# Patient Record
Sex: Female | Born: 1940
Health system: Southern US, Community
[De-identification: ages and names within clinical notes are randomized; demographics above are authoritative.]

## PROBLEM LIST (undated history)

## (undated) DIAGNOSIS — E785 Hyperlipidemia, unspecified: Secondary | ICD-10-CM

## (undated) DIAGNOSIS — I10 Essential (primary) hypertension: Secondary | ICD-10-CM

## (undated) DIAGNOSIS — M8580 Other specified disorders of bone density and structure, unspecified site: Secondary | ICD-10-CM

## (undated) DIAGNOSIS — E669 Obesity, unspecified: Secondary | ICD-10-CM

## (undated) DIAGNOSIS — T7840XA Allergy, unspecified, initial encounter: Secondary | ICD-10-CM

## (undated) HISTORY — DX: Hyperlipidemia, unspecified: E78.5

## (undated) HISTORY — DX: Obesity, unspecified: E66.9

## (undated) HISTORY — PX: OTHER SURGICAL HISTORY: SHX169

## (undated) HISTORY — DX: Allergy, unspecified, initial encounter: T78.40XA

## (undated) HISTORY — DX: Essential (primary) hypertension: I10

## (undated) HISTORY — DX: Other specified disorders of bone density and structure, unspecified site: M85.80

## (undated) HISTORY — PX: ABDOMINAL HYSTERECTOMY: SHX81

---

## 2006-09-17 ENCOUNTER — Ambulatory Visit: Payer: Self-pay | Admitting: Family Medicine

## 2006-09-24 ENCOUNTER — Ambulatory Visit: Payer: Self-pay | Admitting: Family Medicine

## 2006-10-02 ENCOUNTER — Encounter: Admission: RE | Admit: 2006-10-02 | Discharge: 2006-10-02 | Payer: Self-pay | Admitting: Family Medicine

## 2006-10-12 ENCOUNTER — Ambulatory Visit: Payer: Self-pay | Admitting: Family Medicine

## 2007-03-03 ENCOUNTER — Emergency Department (HOSPITAL_COMMUNITY): Admission: EM | Admit: 2007-03-03 | Discharge: 2007-03-03 | Payer: Self-pay | Admitting: Emergency Medicine

## 2007-08-14 ENCOUNTER — Ambulatory Visit: Payer: Self-pay | Admitting: Family Medicine

## 2007-10-24 HISTORY — PX: COLONOSCOPY: SHX174

## 2008-01-13 ENCOUNTER — Ambulatory Visit: Payer: Self-pay | Admitting: Family Medicine

## 2008-03-27 ENCOUNTER — Ambulatory Visit: Payer: Self-pay | Admitting: Family Medicine

## 2008-04-08 ENCOUNTER — Ambulatory Visit: Payer: Self-pay | Admitting: Internal Medicine

## 2008-04-22 ENCOUNTER — Encounter: Payer: Self-pay | Admitting: Internal Medicine

## 2008-04-22 ENCOUNTER — Ambulatory Visit: Payer: Self-pay | Admitting: Internal Medicine

## 2008-04-23 ENCOUNTER — Encounter: Payer: Self-pay | Admitting: Internal Medicine

## 2008-04-23 LAB — HM COLONOSCOPY: HM Colonoscopy: NEGATIVE

## 2008-07-28 ENCOUNTER — Ambulatory Visit: Payer: Self-pay | Admitting: Family Medicine

## 2008-10-08 ENCOUNTER — Ambulatory Visit: Payer: Self-pay | Admitting: Family Medicine

## 2008-11-19 ENCOUNTER — Ambulatory Visit: Payer: Self-pay | Admitting: Family Medicine

## 2009-07-07 ENCOUNTER — Ambulatory Visit: Payer: Self-pay | Admitting: Family Medicine

## 2009-09-23 ENCOUNTER — Ambulatory Visit: Payer: Self-pay | Admitting: Family Medicine

## 2010-05-30 ENCOUNTER — Ambulatory Visit: Payer: Self-pay | Admitting: Family Medicine

## 2010-08-22 ENCOUNTER — Ambulatory Visit: Payer: Self-pay | Admitting: Family Medicine

## 2010-09-19 ENCOUNTER — Ambulatory Visit: Payer: Self-pay | Admitting: Family Medicine

## 2010-11-13 ENCOUNTER — Encounter: Payer: Self-pay | Admitting: Family Medicine

## 2011-03-23 ENCOUNTER — Telehealth: Payer: Self-pay | Admitting: Family Medicine

## 2011-03-23 MED ORDER — QUINAPRIL-HYDROCHLOROTHIAZIDE 10-12.5 MG PO TABS: 1.0000 | ORAL_TABLET | Freq: Every day | ORAL | Status: DC

## 2011-03-23 NOTE — Telephone Encounter (Signed)
Refilled pt med 

## 2011-04-05 ENCOUNTER — Encounter: Payer: Self-pay | Admitting: Family Medicine

## 2011-04-06 ENCOUNTER — Ambulatory Visit (INDEPENDENT_AMBULATORY_CARE_PROVIDER_SITE_OTHER): Payer: Medicare HMO | Admitting: Family Medicine

## 2011-04-06 ENCOUNTER — Other Ambulatory Visit: Payer: Self-pay | Admitting: Family Medicine

## 2011-04-06 ENCOUNTER — Encounter: Payer: Self-pay | Admitting: Family Medicine

## 2011-04-06 VITALS — BP 110/80 | HR 68 | Wt 187.0 lb

## 2011-04-06 DIAGNOSIS — M8580 Other specified disorders of bone density and structure, unspecified site: Secondary | ICD-10-CM

## 2011-04-06 DIAGNOSIS — Z79899 Other long term (current) drug therapy: Secondary | ICD-10-CM

## 2011-04-06 DIAGNOSIS — I10 Essential (primary) hypertension: Secondary | ICD-10-CM

## 2011-04-06 DIAGNOSIS — E785 Hyperlipidemia, unspecified: Secondary | ICD-10-CM

## 2011-04-06 DIAGNOSIS — M949 Disorder of cartilage, unspecified: Secondary | ICD-10-CM

## 2011-04-06 LAB — CBC WITH DIFFERENTIAL/PLATELET
Eosinophils Absolute: 0.1 10*3/uL (ref 0.0–0.7)
Eosinophils Relative: 3 % (ref 0–5)
Hemoglobin: 13.7 g/dL (ref 12.0–15.0)
Lymphs Abs: 2.1 10*3/uL (ref 0.7–4.0)
MCH: 31.4 pg (ref 26.0–34.0)
MCV: 94.5 fL (ref 78.0–100.0)
Neutrophils Relative %: 36 % — ABNORMAL LOW (ref 43–77)
Platelets: 207 10*3/uL (ref 150–400)
RBC: 4.36 MIL/uL (ref 3.87–5.11)
RDW: 13.2 % (ref 11.5–15.5)

## 2011-04-06 LAB — COMPREHENSIVE METABOLIC PANEL
ALT: <8 U/L (ref 0–35)
AST: 19 U/L (ref 0–37)
Albumin: 4.3 g/dL (ref 3.5–5.2)
Alkaline Phosphatase: 77 U/L (ref 39–117)
BUN: 15 mg/dL (ref 6–23)
Calcium: 9.9 mg/dL (ref 8.4–10.5)
Chloride: 107 mEq/L (ref 96–112)
Potassium: 3.8 mEq/L (ref 3.5–5.3)
Sodium: 143 mEq/L (ref 135–145)
Total Bilirubin: 0.6 mg/dL (ref 0.3–1.2)
Total Protein: 7.5 g/dL (ref 6.0–8.3)

## 2011-04-06 LAB — LIPID PANEL: LDL Cholesterol: 100 mg/dL — ABNORMAL HIGH (ref 0–99)

## 2011-04-06 NOTE — Progress Notes (Signed)
  Subjective:    Patient ID: Kristina Maldonado, female    DOB: 25-Apr-1941, 70 y.o.   MRN: [id number]  HPI She is here for medication check. She continues on medications listed in the chart and is having no difficulty with them. She does have a history of osteopenia. Her last DEXA scan was in 2007. She keeps herself quite active and has a trip planned in the near future. She has no particular concerns or complaints.   Review of Systems Review of systems is essentially negative    Objective:   Physical Exam Alert and in no distress. Blood pressure is recorded.       Assessment & Plan:  Osteopenia. Hypertension. Hyperlipidemia. I will set her up for a DEXA scan. Also check routine blood work including vitamin D. Encouraged her to continue remain physically active.

## 2011-04-06 NOTE — Patient Instructions (Signed)
Stay on all your medicines. I will call you with the blood work results

## 2011-04-07 ENCOUNTER — Telehealth: Payer: Self-pay

## 2011-04-07 LAB — VITAMIN D 25 HYDROXY (VIT D DEFICIENCY, FRACTURES): Vit D, 25-Hydroxy: 36 ng/mL (ref 30–89)

## 2011-04-07 NOTE — Progress Notes (Signed)
  Subjective:    Patient ID: Kristina Maldonado, female    DOB: 11-06-1940, 70 y.o.   MRN: [id number]  HPI She is here for a recheck. She does have complaints of itching sensation to the right upper chest and is concerned about shingles   Review of Systems     Objective:   Physical Exam Alert and in no distress. Exam of that area shows no lesions.       Assessment & Plan:  Normal exam.

## 2011-04-07 NOTE — Telephone Encounter (Signed)
Called pt about labs left message labs look good

## 2011-04-11 ENCOUNTER — Ambulatory Visit
Admission: RE | Admit: 2011-04-11 | Discharge: 2011-04-11 | Disposition: A | Discharge status: Home / Self Care | Payer: Medicare HMO | Source: Ambulatory Visit | Attending: Family Medicine | Admitting: Family Medicine

## 2011-04-11 DIAGNOSIS — M8580 Other specified disorders of bone density and structure, unspecified site: Secondary | ICD-10-CM

## 2011-04-11 LAB — HM DEXA SCAN

## 2011-04-13 ENCOUNTER — Telehealth: Payer: Self-pay

## 2011-04-13 ENCOUNTER — Other Ambulatory Visit: Payer: Self-pay

## 2011-04-13 MED ORDER — ALENDRONATE SODIUM 70 MG PO TABS: 70.0000 mg | ORAL_TABLET | ORAL | Status: DC

## 2011-04-13 NOTE — Telephone Encounter (Signed)
Called pt to let her know of her results and Iput an order for her meds

## 2011-04-17 ENCOUNTER — Telehealth: Payer: Self-pay | Admitting: *Deleted

## 2011-04-17 NOTE — Telephone Encounter (Signed)
Left message for patient informing her to call our office and schedule appointment with DrLalonde to discuss treatment options for osteoporosis as her DEXA scan showed her osteoporosis to be worse than in 2007.

## 2011-04-27 ENCOUNTER — Other Ambulatory Visit: Payer: Self-pay | Admitting: Family Medicine

## 2011-05-09 ENCOUNTER — Ambulatory Visit (INDEPENDENT_AMBULATORY_CARE_PROVIDER_SITE_OTHER): Payer: Medicare HMO | Admitting: Family Medicine

## 2011-05-09 ENCOUNTER — Encounter: Payer: Self-pay | Admitting: Family Medicine

## 2011-05-09 VITALS — BP 122/86 | HR 60 | Wt 188.0 lb

## 2011-05-09 DIAGNOSIS — M8580 Other specified disorders of bone density and structure, unspecified site: Secondary | ICD-10-CM

## 2011-05-09 DIAGNOSIS — M899 Disorder of bone, unspecified: Secondary | ICD-10-CM

## 2011-05-09 NOTE — Progress Notes (Signed)
  Subjective:    Patient ID: Kristina Maldonado, female    DOB: [birth year], 70 y.o.   MRN: [id number]  HPI She is here for consultation concerning recent DEXA scan which did show worsening of her osteopenia. She exercises regularly, uses calcium and vitamin D. She does not smoke. There is no family history of osteoporosis. She was started on Fosamax in mid June   Review of Systems     Objective:   Physical Exam Alert and in no distress otherwise not examined.       Assessment & Plan:  Worsening osteopenia She will continue on her Fosamax. We will recheck her DEXA scan in 2 years.

## 2011-05-09 NOTE — Patient Instructions (Signed)
Take the Fosamax weekly on an empty stomach and in the sitting position. We'll do another DEXA scan in 2 years

## 2011-05-29 ENCOUNTER — Other Ambulatory Visit: Payer: Self-pay | Admitting: Family Medicine

## 2011-07-04 ENCOUNTER — Other Ambulatory Visit: Payer: Self-pay | Admitting: Family Medicine

## 2011-07-04 ENCOUNTER — Telehealth: Payer: Self-pay | Admitting: Family Medicine

## 2011-07-04 MED ORDER — QUINAPRIL-HYDROCHLOROTHIAZIDE 10-12.5 MG PO TABS: 1.0000 | ORAL_TABLET | Freq: Every day | ORAL | Status: DC

## 2011-07-04 NOTE — Telephone Encounter (Signed)
Medication refilled

## 2011-09-08 ENCOUNTER — Encounter: Payer: Self-pay | Admitting: Medical

## 2011-09-08 ENCOUNTER — Ambulatory Visit (INDEPENDENT_AMBULATORY_CARE_PROVIDER_SITE_OTHER): Payer: Medicare HMO | Admitting: Medical

## 2011-09-08 VITALS — BP 130/80 | HR 68 | Temp 98.5°F | Resp 16 | Wt 189.0 lb

## 2011-09-08 DIAGNOSIS — J069 Acute upper respiratory infection, unspecified: Secondary | ICD-10-CM | POA: Insufficient documentation

## 2011-09-08 DIAGNOSIS — Z23 Encounter for immunization: Secondary | ICD-10-CM

## 2011-09-08 MED ORDER — AMOXICILLIN 875 MG PO TABS: 875.0000 mg | ORAL_TABLET | Freq: Two times a day (BID) | ORAL | Status: AC

## 2011-09-08 NOTE — Patient Instructions (Signed)
Rest, hydrate well with water, drink some orange juice for some extra vitamin C, and consider OTC Mucinex DM or Coricidin HBP for cough and congestion.    Consider nasal saline spray OTC such as Neti Pot.   If you are worse over the weekend with fever, thick green nasal discharge or thick productive sputum, then begin Amoxicillin.   Otherwise, call if not improving or worse by early next week.   Upper Respiratory Infection, Adult An upper respiratory infection (URI) is also known as the common cold. It is often caused by a type of germ (virus). Colds are easily spread (contagious). You can pass it to others by kissing, coughing, sneezing, or drinking out of the same glass. Usually, you get better in 1 or 2 weeks.  HOME CARE   Only take medicine as told by your doctor.   Use a warm mist humidifier or breathe in steam from a hot shower.   Drink enough water and fluids to keep your pee (urine) clear or pale yellow.   Get plenty of rest.   Return to work when your temperature is back to normal or as told by your doctor. You may use a face mask and wash your hands to stop your cold from spreading.  GET HELP RIGHT AWAY IF:   After the first few days, you feel you are getting worse.   You have questions about your medicine.   You have chills, shortness of breath, or brown or red spit (mucus).   You have yellow or brown snot (nasal discharge) or pain in the face, especially when you bend forward.   You have a fever, puffy (swollen) neck, pain when you swallow, or white spots in the back of your throat.   You have a bad headache, ear pain, sinus pain, or chest pain.   You have a high-pitched whistling sound when you breathe in and out (wheezing).   You have a lasting cough or cough up blood.   You have sore muscles or a stiff neck.  MAKE SURE YOU:   Understand these instructions.   Will watch your condition.   Will get help right away if you are not doing well or get worse.    Document Released: 03/27/2008 Document Revised: 06/21/2011 Document Reviewed: 02/13/2011 [facility] Patient Information 2012 [place], [place].

## 2011-09-08 NOTE — Progress Notes (Signed)
Subjective:   HPI  Kristina Maldonado is a 70 y.o. female who presents for 3 day hx/o cough, scratchy throat, sneezing, feels sick, subjective fever, runny nose, ears hurt, chest congestion, sinus pressure.   She notes that she is rarely sick.  Usually gets sick this time of year.  Denies nausea, vomiting, no sick contacts.  No other aggravating or relieving factors.    No other c/o.  The following portions of the patient's history were reviewed and updated as appropriate: allergies, current medications, past family history, past medical history, past social history, past surgical history and problem list.  Past Medical History  Diagnosis Date  . Hypertension   . Obesity   . Dyslipidemia   . Osteopenia   . Allergy    Review of Systems Constitutional: -fever, +chills, +sweats, -unexpected -weight change,-fatigue ENT: +runny nose, +ear pain, +sore throat Cardiology:  -chest pain, -palpitations, -edema Respiratory: -cough, -shortness of breath, -wheezing Gastroenterology: -abdominal pain, -nausea, -vomiting, -diarrhea, +constipation Hematology: -bleeding or bruising problems Musculoskeletal: -arthralgias, -myalgias, -joint swelling, -back pain Ophthalmology: -vision changes Urology: -dysuria, -difficulty urinating, -hematuria, -urinary frequency, -urgency Neurology: -headache, -weakness, -tingling, -numbness    Objective:   Physical Exam  Filed Vitals:   09/08/11 1358  BP: 130/80  Pulse: 68  Temp: 98.5 F (36.9 C)  Resp: 16    General appearance: alert, no distress, WD/WN Skin: warm HEENT: normocephalic, sclerae anicteric, TMs pearly, nares patent, clear discharge, no erythema, pharynx with post nasal drip Oral cavity: MMM, no lesions Neck: supple, no lymphadenopathy, no thyromegaly, no masses Heart: RRR, normal S1, S2, no murmurs Lungs: CTA bilaterally, no wheezes, rhonchi, or rales   Assessment and Plan :    Encounter Diagnoses  Name Primary?  . URI (upper  respiratory infection) Yes  . Need for prophylactic vaccination and inoculation against influenza    Etiology c/w viral URI.  Discussed supportive care, but advised if worse over next 3-4 days with fever, green/colored thick discharge or sputum, then begin antibiotic Amoxicillin.  She agrees to hold off on antibiotic for now as symptoms suggestive of viral URI.   Flu shot given today.  Follow-up prn.

## 2011-09-11 ENCOUNTER — Telehealth: Payer: Self-pay | Admitting: Family Medicine

## 2011-09-11 NOTE — Telephone Encounter (Signed)
I entered the flu shot. CLS

## 2011-09-11 NOTE — Telephone Encounter (Signed)
Message copied by Janeice Robinson on Mon Sep 11, 2011  8:07 AM ------      Message from: [place], [place] S      Created: Fri Sep 08, 2011  7:58 PM       pls log flu vaccine

## 2011-09-25 ENCOUNTER — Ambulatory Visit (INDEPENDENT_AMBULATORY_CARE_PROVIDER_SITE_OTHER): Payer: Medicare HMO | Admitting: Family Medicine

## 2011-09-25 ENCOUNTER — Encounter: Payer: Self-pay | Admitting: Family Medicine

## 2011-09-25 DIAGNOSIS — J301 Allergic rhinitis due to pollen: Secondary | ICD-10-CM

## 2011-09-25 DIAGNOSIS — I10 Essential (primary) hypertension: Secondary | ICD-10-CM

## 2011-09-25 DIAGNOSIS — E785 Hyperlipidemia, unspecified: Secondary | ICD-10-CM

## 2011-09-25 DIAGNOSIS — M949 Disorder of cartilage, unspecified: Secondary | ICD-10-CM

## 2011-09-25 DIAGNOSIS — M8580 Other specified disorders of bone density and structure, unspecified site: Secondary | ICD-10-CM

## 2011-09-25 NOTE — Patient Instructions (Signed)
Continue to take good care of yourself 

## 2011-09-25 NOTE — Progress Notes (Signed)
  Subjective:    Patient ID: Kristina Maldonado, female    DOB: Mar 15, 1941, 70 y.o.   MRN: [id number]  HPI She is here for a general checkup. She continues on her pressure medications and is having no difficulty with them. She continues on aspirin. She does have history of osteopenia and presently is on Fosamax. Her allergies are under good control. She does not smoke or drink. She does note abdominal bloating especially dairy products. She does keep herself busy with part-time work as well as with her church and her children.   Review of Systems Negative except as above    Objective:   Physical Exam BP 120/76  Pulse 65  Ht 5\' 5"  (1.651 m)  Wt 188 lb (85.276 kg)  BMI 31.28 kg/m2  General Appearance:    Alert, cooperative, no distress, appears stated age  Head:    Normocephalic, without obvious abnormality, atraumatic  Eyes:    PERRL, conjunctiva/corneas clear, EOM's intact, fundi    benign  Ears:    Normal TM's and external ear canals  Nose:   Nares normal, mucosa normal, no drainage or sinus   tenderness  Throat:   Lips, mucosa, and tongue normal; teeth and gums normal  Neck:   Supple, no lymphadenopathy;  thyroid:  no   enlargement/tenderness/nodules; no carotid   bruit or JVD  Back:    Spine nontender, no curvature, ROM normal, no CVA     tenderness  Lungs:     Clear to auscultation bilaterally without wheezes, rales or     ronchi; respirations unlabored  Chest Wall:    No tenderness or deformity   Heart:    Regular rate and rhythm, S1 and S2 normal, no murmur, rub   or gallop  Breast Exam:    Deferred to GYN  Abdomen:     Soft, non-tender, nondistended, normoactive bowel sounds,    no masses, no hepatosplenomegaly  Genitalia:    Deferred      Extremities:   No clubbing, cyanosis or edema  Pulses:   2+ and symmetric all extremities  Skin:   Skin color, texture, turgor normal, no rashes or lesions  Lymph nodes:   Cervical, supraclavicular, and axillary nodes normal  Neurologic:    CNII-XII intact, normal strength, sensation and gait; reflexes 2+ and symmetric throughout          Psych:   Normal mood, affect, hygiene and grooming.           Assessment & Plan:   1. Hyperlipidemia   2. Hypertension   3. Osteopenia   4. Allergic rhinitis due to pollen    5 Lactose intolerance continue on present medications. Continue to use Lactaid for the abdominal bloating.

## 2011-10-25 ENCOUNTER — Other Ambulatory Visit: Payer: Self-pay | Admitting: Family Medicine

## 2011-11-16 ENCOUNTER — Other Ambulatory Visit: Payer: Self-pay

## 2011-11-16 MED ORDER — AMLODIPINE BESYLATE 10 MG PO TABS: 10.0000 mg | ORAL_TABLET | Freq: Every day | ORAL | Status: DC

## 2011-11-16 MED ORDER — ALENDRONATE SODIUM 70 MG PO TABS: 70.0000 mg | ORAL_TABLET | ORAL | Status: DC

## 2011-11-16 MED ORDER — ATORVASTATIN CALCIUM 40 MG PO TABS: 40.0000 mg | ORAL_TABLET | Freq: Every day | ORAL | Status: DC

## 2012-01-26 ENCOUNTER — Other Ambulatory Visit: Payer: Self-pay | Admitting: Medical

## 2012-01-26 ENCOUNTER — Telehealth: Payer: Self-pay | Admitting: Family Medicine

## 2012-01-26 MED ORDER — QUINAPRIL-HYDROCHLOROTHIAZIDE 10-12.5 MG PO TABS: 1.0000 | ORAL_TABLET | Freq: Every day | ORAL | Status: DC

## 2012-01-26 NOTE — Telephone Encounter (Signed)
Advised pt of refill.

## 2012-01-26 NOTE — Telephone Encounter (Signed)
Quinipril/HCT sent to mail order

## 2012-01-26 NOTE — Telephone Encounter (Signed)
Her "fluid pill" Rx did not get sent to mail order co,    Needs sent   [facility]

## 2012-02-08 ENCOUNTER — Encounter: Payer: Self-pay | Admitting: Internal Medicine

## 2012-04-18 ENCOUNTER — Ambulatory Visit (INDEPENDENT_AMBULATORY_CARE_PROVIDER_SITE_OTHER): Payer: Medicare HMO | Admitting: Family Medicine

## 2012-04-18 ENCOUNTER — Encounter: Payer: Self-pay | Admitting: Family Medicine

## 2012-04-18 VITALS — BP 132/80 | HR 80 | Wt 192.0 lb

## 2012-04-18 DIAGNOSIS — R609 Edema, unspecified: Secondary | ICD-10-CM

## 2012-04-18 NOTE — Progress Notes (Signed)
  Subjective:    Patient ID: Kristina Maldonado, female    DOB: Jun 14, 1941, 71 y.o.   MRN: [id number]  HPI She has a one-week history of right foot and ankle swelling. No history of injury however she just went to [place] and did a lot more walking than normal.. No calf pain, shortness of breath or coughing.  Review of Systems     Objective:   Physical Exam Exam of the right lower extremity does show slightly more edema than left with slight pitting. Pulses are normal. Homans sign negative. No adenopathy noted especially in the inguinal area.       Assessment & Plan:  Peripheral edema. Explained that this was probably related to her walking but did tell her to call me if continued difficulty. Also recommend support stockings for this. Do not feel that diuretics would be appropriate.

## 2012-05-01 ENCOUNTER — Other Ambulatory Visit: Payer: Self-pay | Admitting: Medical

## 2012-05-01 NOTE — Telephone Encounter (Signed)
Patient needs to follow up appointment before the medication runs out.

## 2012-06-03 ENCOUNTER — Telehealth: Payer: Self-pay | Admitting: Internal Medicine

## 2012-06-03 NOTE — Telephone Encounter (Signed)
Pt wants to talk to you about this. Not sure if you can answer it or not

## 2012-06-03 NOTE — Telephone Encounter (Signed)
CALLED PT TO HAVE HER CALL INS TO SEE IF THEY WOULD COVER OR NOT

## 2012-08-19 ENCOUNTER — Other Ambulatory Visit: Payer: Self-pay | Admitting: Family Medicine

## 2012-09-18 ENCOUNTER — Encounter: Payer: Self-pay | Admitting: Internal Medicine

## 2012-10-07 ENCOUNTER — Encounter: Payer: Self-pay | Admitting: Family Medicine

## 2012-10-07 ENCOUNTER — Ambulatory Visit (INDEPENDENT_AMBULATORY_CARE_PROVIDER_SITE_OTHER): Payer: Medicare HMO | Admitting: Family Medicine

## 2012-10-07 VITALS — BP 130/90 | HR 45 | Ht 65.5 in | Wt 188.0 lb

## 2012-10-07 DIAGNOSIS — I10 Essential (primary) hypertension: Secondary | ICD-10-CM

## 2012-10-07 DIAGNOSIS — Z0000 Encounter for general adult medical examination without abnormal findings: Secondary | ICD-10-CM

## 2012-10-07 DIAGNOSIS — J301 Allergic rhinitis due to pollen: Secondary | ICD-10-CM

## 2012-10-07 DIAGNOSIS — Z79899 Other long term (current) drug therapy: Secondary | ICD-10-CM

## 2012-10-07 DIAGNOSIS — M810 Age-related osteoporosis without current pathological fracture: Secondary | ICD-10-CM

## 2012-10-07 DIAGNOSIS — E785 Hyperlipidemia, unspecified: Secondary | ICD-10-CM

## 2012-10-07 LAB — COMPREHENSIVE METABOLIC PANEL
ALT: 8 U/L (ref 0–35)
AST: 23 U/L (ref 0–37)
Alkaline Phosphatase: 57 U/L (ref 39–117)
CO2: 25 mEq/L (ref 19–32)
Chloride: 105 mEq/L (ref 96–112)
Glucose, Bld: 83 mg/dL (ref 70–99)
Total Bilirubin: 0.6 mg/dL (ref 0.3–1.2)
Total Protein: 7.1 g/dL (ref 6.0–8.3)

## 2012-10-07 LAB — CBC WITH DIFFERENTIAL/PLATELET
HCT: 41.9 % (ref 36.0–46.0)
Hemoglobin: 14.5 g/dL (ref 12.0–15.0)
Lymphocytes Relative: 42 % (ref 12–46)
Lymphs Abs: 1.9 10*3/uL (ref 0.7–4.0)
MCH: 32.4 pg (ref 26.0–34.0)
MCHC: 34.6 g/dL (ref 30.0–36.0)
Monocytes Relative: 6 % (ref 3–12)
Neutro Abs: 2.3 10*3/uL (ref 1.7–7.7)
Neutrophils Relative %: 50 % (ref 43–77)
Platelets: 220 10*3/uL (ref 150–400)
RBC: 4.48 MIL/uL (ref 3.87–5.11)

## 2012-10-07 LAB — POCT URINALYSIS DIPSTICK
Bilirubin, UA: NEGATIVE
Glucose, UA: NEGATIVE
Ketones, UA: NEGATIVE
Leukocytes, UA: NEGATIVE
Protein, UA: NEGATIVE
Spec Grav, UA: <=1.005

## 2012-10-07 LAB — LIPID PANEL
Cholesterol: 227 mg/dL — ABNORMAL HIGH (ref 0–200)
Total CHOL/HDL Ratio: 2.1 Ratio

## 2012-10-07 MED ORDER — ALENDRONATE SODIUM 70 MG PO TABS: 70.0000 mg | ORAL_TABLET | ORAL | Status: DC

## 2012-10-07 MED ORDER — AMLODIPINE BESYLATE 10 MG PO TABS: 10.0000 mg | ORAL_TABLET | Freq: Every day | ORAL | Status: DC

## 2012-10-07 MED ORDER — QUINAPRIL-HYDROCHLOROTHIAZIDE 10-12.5 MG PO TABS: 1.0000 | ORAL_TABLET | Freq: Every day | ORAL | Status: DC

## 2012-10-07 MED ORDER — ATORVASTATIN CALCIUM 40 MG PO TABS: 40.0000 mg | ORAL_TABLET | Freq: Every day | ORAL | Status: DC

## 2012-10-07 NOTE — Progress Notes (Signed)
  Subjective:    Patient ID: Kristina Maldonado, female    DOB: [birth year], 71 y.o.   MRN: [id number]  HPI She is here for a complete examination. She continues on her blood pressure medications and is having no difficulty with them. She also takes Lipitor. She has been on Fosamax for 2 years. Her allergies are under good control. Her immunizations are up to date. She had a colonoscopy in 2009. She's had a hysterectomy. She has no particular concerns or complaints.   Review of Systems  Constitutional: Negative.   HENT: Negative.   Eyes: Negative.   Respiratory: Negative.   Cardiovascular: Negative.   Gastrointestinal: Negative.   Genitourinary: Negative.   Musculoskeletal: Negative.   Neurological: Negative.   Hematological: Negative.   Psychiatric/Behavioral: Negative.        Objective:   Physical Exam BP 130/90  Pulse 45  Ht 5' 5.5" (1.664 m)  Wt 188 lb (85.276 kg)  BMI 30.81 kg/m2  SpO2 98%  General Appearance:    Alert, cooperative, no distress, appears stated age  Head:    Normocephalic, without obvious abnormality, atraumatic  Eyes:    PERRL, conjunctiva/corneas clear, EOM's intact, fundi    benign  Ears:    Normal TM's and external ear canals  Nose:   Nares normal, mucosa normal, no drainage or sinus   tenderness  Throat:   Lips, mucosa, and tongue normal; teeth and gums normal  Neck:   Supple, no lymphadenopathy;  thyroid:  no   enlargement/tenderness/nodules; no carotid   bruit or JVD  Back:    Spine nontender, no curvature, ROM normal, no CVA     tenderness  Lungs:     Clear to auscultation bilaterally without wheezes, rales or     ronchi; respirations unlabored  Chest Wall:    No tenderness or deformity   Heart:    Regular rate and rhythm, S1 and S2 normal, no murmur, rub   or gallop  Breast Exam:    Deferred to GYN  Abdomen:     Soft, non-tender, nondistended, normoactive bowel sounds,    no masses, no hepatosplenomegaly  Genitalia:    Deferred to GYN      Extremities:   No clubbing, cyanosis or edema  Pulses:   2+ and symmetric all extremities  Skin:   Skin color, texture, turgor normal, no rashes or lesions  Lymph nodes:   Cervical, supraclavicular, and axillary nodes normal  Neurologic:   CNII-XII intact, normal strength, sensation and gait; reflexes 2+ and symmetric throughout          Psych:   Normal mood, affect, hygiene and grooming.           Assessment & Plan:   1. Hypertension  quinapril-hydrochlorothiazide (ACCURETIC) 10-12.5 MG per tablet, amLODipine (NORVASC) 10 MG tablet, Lipid panel, CBC with Differential, Comprehensive metabolic panel, POCT Urinalysis Dipstick  2. Hyperlipidemia  atorvastatin (LIPITOR) 40 MG tablet, Lipid panel, CBC with Differential, Comprehensive metabolic panel  3. Allergic rhinitis due to pollen    4. Osteoporosis  alendronate (FOSAMAX) 70 MG tablet, Comprehensive metabolic panel  5. Routine general medical examination at a health care facility    6. Encounter for long-term (current) use of other medications  Lipid panel, CBC with Differential, Comprehensive metabolic panel   I encouraged her to continue to take good care of herself.

## 2012-10-08 ENCOUNTER — Other Ambulatory Visit: Payer: Self-pay | Admitting: Medical

## 2012-10-08 NOTE — Progress Notes (Signed)
Quick Note:  CALLED PT CELL # PT INFORMED OF LABS AND VERBALIZED UNDERSTANDING ______

## 2012-12-25 LAB — HM DIABETES EYE EXAM

## 2013-06-09 ENCOUNTER — Telehealth: Payer: Self-pay | Admitting: Family Medicine

## 2013-06-09 NOTE — Telephone Encounter (Signed)
Pt called states she got stung on Friday and there is a large red egg on the inside of her thigh.  There is no streaking, only itching and redness.  She will watch and treat over the counter for a couple of days and let us know if she needs to come in.

## 2013-06-18 ENCOUNTER — Encounter: Payer: Self-pay | Admitting: Medical

## 2013-06-18 ENCOUNTER — Ambulatory Visit (INDEPENDENT_AMBULATORY_CARE_PROVIDER_SITE_OTHER): Payer: Medicare HMO | Admitting: Medical

## 2013-06-18 VITALS — BP 132/82 | HR 68 | Temp 97.8°F | Resp 16 | Wt 191.0 lb

## 2013-06-18 DIAGNOSIS — R21 Rash and other nonspecific skin eruption: Secondary | ICD-10-CM

## 2013-06-18 MED ORDER — HYDROXYZINE HCL 25 MG PO TABS: ORAL_TABLET | ORAL | Status: DC

## 2013-06-18 MED ORDER — DOXYCYCLINE HYCLATE 100 MG PO TABS: 100.0000 mg | ORAL_TABLET | Freq: Two times a day (BID) | ORAL | Status: DC

## 2013-06-18 NOTE — Progress Notes (Signed)
Subjective: Here for rash.  Last week was out in the garden and since then started having a sting sensation on her upper left upper thigh.  Started noticing red rash that comes and goes.  Used cortisone cream topically. Started stinging again yesterday.  Denies specific bite or tick, but not sure what this is.    Objective: Gen: wd, wn, nad Skin: left upper lateral thigh with large patch of raised whealed rash, but faint coloration/skin color, only slightly pink coloration.  There is a more central 3cm slightly warm, raised whealed lesion suggestive of insect bite.  No obvious cellulitis.  Leg without edema, normal pulses and sensation  Assessment: Encounter Diagnosis  Name Primary?  . Rash and nonspecific skin eruption Yes   Plan: Etiology appears to be insect bite and hives.  There is some warmth.  Can c/t topical Cortaid OTC.  Patient Instructions  Your current rash of left hip looks like an insect bite and possible allergic response/hive.    Begin Hydroxyzine 25mg , 1/2- 1 tablet two to three times daily for itching and rash.  If you begin seeing worse redness, hot feeling, pain in the area, either let me know, recheck, or begin Doxycycline antibiotic.

## 2013-06-18 NOTE — Patient Instructions (Signed)
Your current rash of left hip looks like an insect bite and possible allergic response/hive.    Begin Hydroxyzine 25mg , 1/2- 1 tablet two to three times daily for itching and rash.  If you begin seeing worse redness, hot feeling, pain in the area, either let me know, recheck, or begin Doxycycline antibiotic.

## 2013-07-09 ENCOUNTER — Encounter: Payer: Self-pay | Admitting: Internal Medicine

## 2013-10-10 ENCOUNTER — Encounter: Payer: Self-pay | Admitting: Family Medicine

## 2013-10-10 ENCOUNTER — Ambulatory Visit (INDEPENDENT_AMBULATORY_CARE_PROVIDER_SITE_OTHER): Payer: Medicare HMO | Admitting: Family Medicine

## 2013-10-10 ENCOUNTER — Telehealth: Payer: Self-pay | Admitting: Family Medicine

## 2013-10-10 VITALS — BP 120/82 | HR 78 | Ht 66.0 in | Wt 191.0 lb

## 2013-10-10 DIAGNOSIS — M810 Age-related osteoporosis without current pathological fracture: Secondary | ICD-10-CM | POA: Diagnosis not present

## 2013-10-10 DIAGNOSIS — M1990 Unspecified osteoarthritis, unspecified site: Secondary | ICD-10-CM

## 2013-10-10 DIAGNOSIS — Z0000 Encounter for general adult medical examination without abnormal findings: Secondary | ICD-10-CM | POA: Diagnosis not present

## 2013-10-10 DIAGNOSIS — Z79899 Other long term (current) drug therapy: Secondary | ICD-10-CM

## 2013-10-10 DIAGNOSIS — J301 Allergic rhinitis due to pollen: Secondary | ICD-10-CM | POA: Diagnosis not present

## 2013-10-10 DIAGNOSIS — E785 Hyperlipidemia, unspecified: Secondary | ICD-10-CM | POA: Diagnosis not present

## 2013-10-10 DIAGNOSIS — M129 Arthropathy, unspecified: Secondary | ICD-10-CM

## 2013-10-10 DIAGNOSIS — I10 Essential (primary) hypertension: Secondary | ICD-10-CM | POA: Diagnosis not present

## 2013-10-10 LAB — CBC WITH DIFFERENTIAL/PLATELET
Basophils Absolute: 0 10*3/uL (ref 0.0–0.1)
Basophils Relative: 1 % (ref 0–1)
Eosinophils Absolute: 0.1 10*3/uL (ref 0.0–0.7)
MCH: 32.2 pg (ref 26.0–34.0)
MCHC: 34.6 g/dL (ref 30.0–36.0)
Monocytes Relative: 7 % (ref 3–12)
Neutrophils Relative %: 53 % (ref 43–77)
Platelets: 218 10*3/uL (ref 150–400)
RDW: 14.2 % (ref 11.5–15.5)

## 2013-10-10 LAB — LIPID PANEL
LDL Cholesterol: 97 mg/dL (ref 0–99)
Triglycerides: 47 mg/dL (ref <150)
VLDL: 9 mg/dL (ref 0–40)

## 2013-10-10 LAB — COMPREHENSIVE METABOLIC PANEL
AST: 17 U/L (ref 0–37)
Alkaline Phosphatase: 65 U/L (ref 39–117)
Glucose, Bld: 91 mg/dL (ref 70–99)
Sodium: 141 mEq/L (ref 135–145)
Total Bilirubin: 0.5 mg/dL (ref 0.3–1.2)
Total Protein: 6.9 g/dL (ref 6.0–8.3)

## 2013-10-10 LAB — POCT URINALYSIS DIPSTICK
Blood, UA: NEGATIVE
Leukocytes, UA: NEGATIVE
Nitrite, UA: NEGATIVE
Protein, UA: NEGATIVE
Urobilinogen, UA: NEGATIVE
pH, UA: 6

## 2013-10-10 MED ORDER — ATORVASTATIN CALCIUM 40 MG PO TABS: 40.0000 mg | ORAL_TABLET | Freq: Every day | ORAL | Status: DC

## 2013-10-10 MED ORDER — AMLODIPINE BESYLATE 10 MG PO TABS: 10.0000 mg | ORAL_TABLET | Freq: Every day | ORAL | Status: DC

## 2013-10-10 MED ORDER — ALENDRONATE SODIUM 70 MG PO TABS: 70.0000 mg | ORAL_TABLET | ORAL | Status: DC

## 2013-10-10 MED ORDER — QUINAPRIL-HYDROCHLOROTHIAZIDE 10-12.5 MG PO TABS: ORAL_TABLET | ORAL | Status: DC

## 2013-10-10 NOTE — Progress Notes (Signed)
   Subjective:    Patient ID: Kristina Maldonado, female    DOB: 02-10-1941, 72 y.o.   MRN: [id number]  HPI Is here for complete examination. She does have hypertension and continues on her medications. She also is taking Fosamax and does have a history of osteoporosis. She continues on Lipitor without difficulty. Her allergies are under good control. She has noted more aches and pains especially in her joints after she has been physically active. Her immunizations were reviewed. She has had a colonoscopy. She has had a recent DEXA scan. A full and family history. She is retired and enjoying her retirement. She does travel and visit her relatives.   Review of Systems Negative except as above    Objective:   Physical Exam BP 120/82  Pulse 78  Ht 5\' 6"  (1.676 m)  Wt 191 lb (86.637 kg)  BMI 30.84 kg/m2  SpO2 98%  General Appearance:    Alert, cooperative, no distress, appears stated age  Head:    Normocephalic, without obvious abnormality, atraumatic  Eyes:    PERRL, conjunctiva/corneas clear, EOM's intact, fundi    benign  Ears:    Normal TM's and external ear canals  Nose:   Nares normal, mucosa normal, no drainage or sinus   tenderness  Throat:   Lips, mucosa, and tongue normal; teeth and gums normal  Neck:   Supple, no lymphadenopathy;  thyroid:  no   enlargement/tenderness/nodules; no carotid   bruit or JVD  Back:    Spine nontender, no curvature, ROM normal, no CVA     tenderness  Lungs:     Clear to auscultation bilaterally without wheezes, rales or     ronchi; respirations unlabored  Chest Wall:    No tenderness or deformity   Heart:    Regular rate and rhythm, S1 and S2 normal, no murmur, rub   or gallop  Breast Exam:    Deferred to GYN  Abdomen:     Soft, non-tender, nondistended, normoactive bowel sounds,    no masses, no hepatosplenomegaly  Genitalia:    Deferred to GYN     Extremities:   No clubbing, cyanosis or edema  Pulses:   2+ and symmetric all extremities  Skin:    Skin color, texture, turgor normal, no rashes or lesions  Lymph nodes:   Cervical, supraclavicular, and axillary nodes normal  Neurologic:   CNII-XII intact, normal strength, sensation and gait; reflexes 2+ and symmetric throughout          Psych:   Normal mood, affect, hygiene and grooming.          Assessment & Plan:  Hypertension - Plan: POCT Urinalysis Dipstick, amLODipine (NORVASC) 10 MG tablet, quinapril-hydrochlorothiazide (ACCURETIC) 10-12.5 MG per tablet, CBC with Differential, Comprehensive metabolic panel  Osteoporosis - Plan: alendronate (FOSAMAX) 70 MG tablet, DG Bone Density  Hyperlipidemia - Plan: atorvastatin (LIPITOR) 40 MG tablet  Encounter for long-term (current) use of other medications - Plan: DG Bone Density, CBC with Differential, Comprehensive metabolic panel, Lipid panel  Allergic rhinitis due to pollen  Arthritis  Routine general medical examination at a health care facility - Plan: CBC with Differential, Comprehensive metabolic panel, Lipid panel  stool cards given. Continue on her other medications. Recommend Tylenol for her aches and pains. Encouraged her to continue with her active lifestyle.

## 2013-10-10 NOTE — Telephone Encounter (Signed)
All of her Rx's need to go to [facility] for 90 day supply not to [facility]

## 2013-10-10 NOTE — Patient Instructions (Signed)
Tylenol for aches and pains

## 2013-10-21 ENCOUNTER — Other Ambulatory Visit (INDEPENDENT_AMBULATORY_CARE_PROVIDER_SITE_OTHER): Payer: Medicare HMO

## 2013-10-21 DIAGNOSIS — Z1211 Encounter for screening for malignant neoplasm of colon: Secondary | ICD-10-CM

## 2013-10-21 LAB — HEMOCCULT GUIAC POC 1CARD (OFFICE): Card #2 Fecal Occult Blod, POC: NEGATIVE

## 2013-11-03 ENCOUNTER — Other Ambulatory Visit: Payer: Self-pay | Admitting: Family Medicine

## 2013-12-19 ENCOUNTER — Other Ambulatory Visit: Payer: Self-pay | Admitting: Family Medicine

## 2014-02-09 LAB — HM MAMMOGRAPHY: HM Mammogram: NEGATIVE

## 2014-02-17 ENCOUNTER — Encounter: Payer: Self-pay | Admitting: Family Medicine

## 2014-03-18 LAB — HM DIABETES EYE EXAM

## 2014-03-27 ENCOUNTER — Encounter: Payer: Self-pay | Admitting: Family Medicine

## 2014-04-02 ENCOUNTER — Encounter: Payer: Self-pay | Admitting: Internal Medicine

## 2014-05-29 ENCOUNTER — Ambulatory Visit (INDEPENDENT_AMBULATORY_CARE_PROVIDER_SITE_OTHER): Payer: Commercial Managed Care - HMO | Admitting: Family Medicine

## 2014-05-29 VITALS — BP 124/80 | HR 85 | Wt 196.0 lb

## 2014-05-29 DIAGNOSIS — R51 Headache: Secondary | ICD-10-CM

## 2014-05-29 DIAGNOSIS — M810 Age-related osteoporosis without current pathological fracture: Secondary | ICD-10-CM

## 2014-05-29 NOTE — Patient Instructions (Addendum)
Take 4 ibuprofen 3 times per day. If your headache gets worse give me a call

## 2014-05-29 NOTE — Progress Notes (Signed)
   Subjective:    Patient ID: Kristina Maldonado, female    DOB: 1941/08/05, 73 y.o.   MRN: [id number]  HPI She complains of a two-day history of left-sided headache. She has no previous history of difficulty with headache. She's had no nausea, vomiting, blurred or double vision, sneezing, itchy watery eyes. She does take it she feels as if she is leaning to the left.  Review of Systems     Objective:   Physical Exam alert and in no distress. Cerebellar testing normal. Normal finger to nose. EOMI. DTRs normal. Exam of the scalp shows no lesions. Tympanic membranes and canals are normal. Throat is clear. Tonsils are normal. Neck is supple without adenopathy or thyromegaly. Cardiac exam shows a regular sinus rhythm without murmurs or gallops. Lungs are clear to auscultation. Review of her record indicates need for repeat DEXA scan due to history of osteoporosis.      Assessment & Plan:  Headache(784.0)  Osteoporosis - Plan: DG Bone Density  will treat the headache conservatively with ibuprofen. Cautioned her to call me if the symptoms worsen. It is bothersome that she does not have a previous history of headaches. If she gets worse, further evaluation will be needed.

## 2014-06-05 LAB — HM DEXA SCAN

## 2014-06-18 ENCOUNTER — Encounter: Payer: Self-pay | Admitting: Internal Medicine

## 2014-06-18 ENCOUNTER — Encounter: Payer: Self-pay | Admitting: Family Medicine

## 2014-07-02 ENCOUNTER — Telehealth: Payer: Self-pay | Admitting: Family Medicine

## 2014-07-02 NOTE — Telephone Encounter (Signed)
She has low bone mass which says that her medication is working. Have her continue on that and we can recheck this DEXA scan in 2 years.

## 2014-07-02 NOTE — Telephone Encounter (Signed)
LEFT WORD FOR WORD MESSAGE ON MACHINE

## 2014-07-02 NOTE — Telephone Encounter (Signed)
Pt was in the office today so she wanted to know if Dr Susann Givens had rcvd her Bone Density report from 06/05/14, Advised her that it did come to our office. She then wanted to know if it meant that it was normal and she was to continue on her same meds since she had not heard anything different from Dr Susann Givens.

## 2014-07-15 ENCOUNTER — Encounter: Payer: Self-pay | Admitting: Internal Medicine

## 2014-10-21 ENCOUNTER — Ambulatory Visit (INDEPENDENT_AMBULATORY_CARE_PROVIDER_SITE_OTHER): Payer: Commercial Managed Care - HMO | Admitting: Family Medicine

## 2014-10-21 ENCOUNTER — Encounter: Payer: Self-pay | Admitting: Family Medicine

## 2014-10-21 VITALS — BP 120/80 | HR 78 | Ht 65.0 in | Wt 197.0 lb

## 2014-10-21 DIAGNOSIS — M810 Age-related osteoporosis without current pathological fracture: Secondary | ICD-10-CM

## 2014-10-21 DIAGNOSIS — E785 Hyperlipidemia, unspecified: Secondary | ICD-10-CM

## 2014-10-21 DIAGNOSIS — Z79899 Other long term (current) drug therapy: Secondary | ICD-10-CM

## 2014-10-21 DIAGNOSIS — Z0000 Encounter for general adult medical examination without abnormal findings: Secondary | ICD-10-CM

## 2014-10-21 DIAGNOSIS — I10 Essential (primary) hypertension: Secondary | ICD-10-CM

## 2014-10-21 DIAGNOSIS — J301 Allergic rhinitis due to pollen: Secondary | ICD-10-CM

## 2014-10-21 LAB — POCT URINALYSIS DIPSTICK
BILIRUBIN UA: NEGATIVE
Blood, UA: NEGATIVE
GLUCOSE UA: NEGATIVE
Ketones, UA: NEGATIVE
LEUKOCYTES UA: NEGATIVE
NITRITE UA: NEGATIVE
PH UA: 7
Protein, UA: NEGATIVE
Spec Grav, UA: 1.015
Urobilinogen, UA: NEGATIVE

## 2014-10-21 LAB — COMPREHENSIVE METABOLIC PANEL
ALT: 9 U/L (ref 0–35)
AST: 19 U/L (ref 0–37)
Albumin: 4.1 g/dL (ref 3.5–5.2)
Alkaline Phosphatase: 59 U/L (ref 39–117)
BILIRUBIN TOTAL: 0.7 mg/dL (ref 0.2–1.2)
BUN: 9 mg/dL (ref 6–23)
CALCIUM: 9.5 mg/dL (ref 8.4–10.5)
CO2: 27 mEq/L (ref 19–32)
Chloride: 104 mEq/L (ref 96–112)
Creat: 0.89 mg/dL (ref 0.50–1.10)
Glucose, Bld: 87 mg/dL (ref 70–99)
Potassium: 3.8 mEq/L (ref 3.5–5.3)
SODIUM: 141 meq/L (ref 135–145)
TOTAL PROTEIN: 7.1 g/dL (ref 6.0–8.3)

## 2014-10-21 LAB — LIPID PANEL
CHOLESTEROL: 230 mg/dL — AB (ref 0–200)
HDL: 99 mg/dL (ref >39)
LDL Cholesterol: 117 mg/dL — ABNORMAL HIGH (ref 0–99)
Total CHOL/HDL Ratio: 2.3 Ratio
Triglycerides: 71 mg/dL (ref <150)
VLDL: 14 mg/dL (ref 0–40)

## 2014-10-21 NOTE — Progress Notes (Signed)
   Subjective:    Patient ID: Kristina Maldonado, female    DOB: [birth year], 73 y.o.   MRN: [id number]  HPI She is here for complete examination. She does have underlying allergies and they're well controlled. She continues on atorvastatin and amlodipine and is doing well on them. She also takes Fosamax and has been on that for approximately 3 years. Her immunizations were reviewed and health maintenance was also reviewed. She does need referral to ophthalmology. Last mammogram was April 2015.Family and social history was also reviewed.   Review of Systems  All other systems reviewed and are negative.      Objective:   Physical Exam BP 120/80 mmHg  Pulse 78  Ht 5\' 5"  (1.651 m)  Wt 197 lb (89.359 kg)  BMI 32.78 kg/m2  SpO2 96%  General Appearance:    Alert, cooperative, no distress, appears stated age  Head:    Normocephalic, without obvious abnormality, atraumatic  Eyes:    PERRL, conjunctiva/corneas clear, EOM's intact, fundi    benign  Ears:    Normal TM's and external ear canals  Nose:   Nares normal, mucosa normal, no drainage or sinus   tenderness  Throat:   Lips, mucosa, and tongue normal; teeth and gums normal  Neck:   Supple, no lymphadenopathy;  thyroid:  no   enlargement/tenderness/nodules; no carotid   bruit or JVD  Back:    Spine nontender, no curvature, ROM normal, no CVA     tenderness  Lungs:     Clear to auscultation bilaterally without wheezes, rales or     ronchi; respirations unlabored  Chest Wall:    No tenderness or deformity   Heart:    Regular rate and rhythm, S1 and S2 normal, no murmur, rub   or gallop  Breast Exam:    Deferred to GYN  Abdomen:     Soft, non-tender, nondistended, normoactive bowel sounds,    no masses, no hepatosplenomegaly  Genitalia:    Deferred to GYN     Extremities:   No clubbing, cyanosis or edema  Pulses:   2+ and symmetric all extremities  Skin:   Skin color, texture, turgor normal, no rashes or lesions  Lymph nodes:    Cervical, supraclavicular, and axillary nodes normal  Neurologic:   CNII-XII intact, normal strength, sensation and gait; reflexes 2+ and symmetric throughout          Psych:   Normal mood, affect, hygiene and grooming.        Assessment & Plan:  Routine general medical examination at a health care facility  Allergic rhinitis due to pollen  Encounter for long-term (current) use of medications - Plan: CBC with Differential, Comprehensive metabolic panel, Lipid panel  Essential hypertension - Plan: POCT Urinalysis Dipstick, CBC with Differential, Comprehensive metabolic panel  Hyperlipidemia - Plan: Lipid panel  Osteoporosis She will continue on her present medication regimen. Referral for ophthalmology will be made. Encouraged her to become more physically active. Discussed 150 minutes a week of something physical with her.

## 2014-10-22 LAB — CBC WITH DIFFERENTIAL/PLATELET
BASOS PCT: 0 % (ref 0–1)
Basophils Absolute: 0 10*3/uL (ref 0.0–0.1)
EOS ABS: 0.1 10*3/uL (ref 0.0–0.7)
Eosinophils Relative: 1 % (ref 0–5)
HCT: 42.4 % (ref 36.0–46.0)
Hemoglobin: 14.4 g/dL (ref 12.0–15.0)
LYMPHS PCT: 40 % (ref 12–46)
Lymphs Abs: 2 10*3/uL (ref 0.7–4.0)
MCH: 31.7 pg (ref 26.0–34.0)
MCHC: 34 g/dL (ref 30.0–36.0)
MCV: 93.4 fL (ref 78.0–100.0)
MONO ABS: 0.3 10*3/uL (ref 0.1–1.0)
MPV: 10 fL (ref 8.6–12.4)
Monocytes Relative: 6 % (ref 3–12)
Neutro Abs: 2.7 10*3/uL (ref 1.7–7.7)
Neutrophils Relative %: 53 % (ref 43–77)
Platelets: 209 10*3/uL (ref 150–400)
RBC: 4.54 MIL/uL (ref 3.87–5.11)
RDW: 14.1 % (ref 11.5–15.5)
WBC: 5 10*3/uL (ref 4.0–10.5)

## 2014-10-23 ENCOUNTER — Other Ambulatory Visit: Payer: Self-pay | Admitting: Family Medicine

## 2014-10-30 ENCOUNTER — Other Ambulatory Visit: Payer: Self-pay | Admitting: Family Medicine

## 2015-01-05 ENCOUNTER — Other Ambulatory Visit: Payer: Self-pay

## 2015-01-05 ENCOUNTER — Telehealth: Payer: Self-pay | Admitting: Internal Medicine

## 2015-01-05 MED ORDER — QUINAPRIL-HYDROCHLOROTHIAZIDE 10-12.5 MG PO TABS: 1.0000 | ORAL_TABLET | Freq: Every day | ORAL | Status: DC

## 2015-01-05 MED ORDER — AMLODIPINE BESYLATE 10 MG PO TABS: 10.0000 mg | ORAL_TABLET | Freq: Every day | ORAL | Status: DC

## 2015-01-05 MED ORDER — ALENDRONATE SODIUM 70 MG PO TABS: ORAL_TABLET | ORAL | Status: DC

## 2015-01-05 NOTE — Telephone Encounter (Signed)
Refill request for Alendronate 70mg , Atorvastatin 40mg , Quinapril-hctz 10-12.5mg  #90 to [facility]

## 2015-01-05 NOTE — Telephone Encounter (Signed)
done

## 2015-01-07 ENCOUNTER — Other Ambulatory Visit: Payer: Self-pay

## 2015-01-07 ENCOUNTER — Telehealth: Payer: Self-pay | Admitting: Internal Medicine

## 2015-01-07 MED ORDER — ATORVASTATIN CALCIUM 40 MG PO TABS: 40.0000 mg | ORAL_TABLET | Freq: Every day | ORAL | Status: DC

## 2015-01-07 NOTE — Telephone Encounter (Signed)
Refill request for atorvastatin 40mg  # 90 to [facility]

## 2015-01-07 NOTE — Telephone Encounter (Signed)
done

## 2015-02-11 LAB — HM MAMMOGRAPHY: HM MAMMO: NEGATIVE

## 2015-02-23 ENCOUNTER — Encounter: Payer: Self-pay | Admitting: Internal Medicine

## 2015-02-23 ENCOUNTER — Encounter: Payer: Self-pay | Admitting: Family Medicine

## 2015-03-04 ENCOUNTER — Telehealth: Payer: Self-pay | Admitting: Family Medicine

## 2015-03-04 NOTE — Telephone Encounter (Signed)
Did referral through [employer] connects online and this was approved Dx code- H40.029 Dr. Mateo FlowKathryn Hecker- [id number]

## 2015-03-04 NOTE — Telephone Encounter (Signed)
Pt states has [employer] ins and needs referral to her eye doctor Dr. Elmer PickerHecker she has an appt this month and has to have a referral

## 2015-03-15 ENCOUNTER — Encounter: Payer: Self-pay | Admitting: Internal Medicine

## 2015-06-24 ENCOUNTER — Other Ambulatory Visit: Payer: Self-pay | Admitting: Family Medicine

## 2015-07-02 ENCOUNTER — Telehealth: Payer: Self-pay | Admitting: Family Medicine

## 2015-07-02 NOTE — Telephone Encounter (Signed)
Requesting referral to Dr Elmer Picker for eye appt on 07/14/15

## 2015-07-04 NOTE — Telephone Encounter (Signed)
ok 

## 2015-07-05 NOTE — Telephone Encounter (Signed)
This was supposed to go to you 

## 2015-07-05 NOTE — Telephone Encounter (Signed)
DONE REFERRAL FOR DR.HECKER

## 2015-09-21 ENCOUNTER — Ambulatory Visit (INDEPENDENT_AMBULATORY_CARE_PROVIDER_SITE_OTHER): Payer: Commercial Managed Care - HMO | Admitting: Family Medicine

## 2015-09-21 ENCOUNTER — Encounter: Payer: Self-pay | Admitting: Family Medicine

## 2015-09-21 VITALS — BP 124/86 | HR 80 | Ht 65.0 in | Wt 197.0 lb

## 2015-09-21 DIAGNOSIS — I10 Essential (primary) hypertension: Secondary | ICD-10-CM

## 2015-09-21 DIAGNOSIS — R208 Other disturbances of skin sensation: Secondary | ICD-10-CM | POA: Diagnosis not present

## 2015-09-21 DIAGNOSIS — Z23 Encounter for immunization: Secondary | ICD-10-CM | POA: Diagnosis not present

## 2015-09-21 NOTE — Progress Notes (Signed)
   Subjective:    Patient ID: Kristina Maldonado, female    DOB: [birth year], 74 y.o.   MRN: [id number]  HPI She is here mainly at the request of friends and relatives due to cold hands and cold feet. She says she has had this her entire life. She does have hypertension and presently is on amlodipine. She is on no decongestants and does not drink a lot of coffee.   Review of Systems     Objective:   Physical Exam Exam of the hands and feet does show good pulses with good capillary refill and normal strength. The skin is normal.       Assessment & Plan:  Need for prophylactic vaccination and inoculation against influenza - Plan: Flu vaccine HIGH DOSE PF (Fluzone High dose)  Essential hypertension  Cold hands and feet without peripheral vascular disease  I reassured her that since she has had this her whole life, there is no reason for any concern. She was comfortable with this.

## 2015-10-22 ENCOUNTER — Telehealth: Payer: Self-pay | Admitting: Family Medicine

## 2015-10-22 MED ORDER — QUINAPRIL-HYDROCHLOROTHIAZIDE 10-12.5 MG PO TABS: 1.0000 | ORAL_TABLET | Freq: Every day | ORAL | Status: DC

## 2015-10-22 MED ORDER — AMLODIPINE BESYLATE 10 MG PO TABS: 10.0000 mg | ORAL_TABLET | Freq: Every day | ORAL | Status: DC

## 2015-10-22 MED ORDER — ALENDRONATE SODIUM 70 MG PO TABS: ORAL_TABLET | ORAL | Status: DC

## 2015-10-22 MED ORDER — ATORVASTATIN CALCIUM 40 MG PO TABS: 40.0000 mg | ORAL_TABLET | Freq: Every day | ORAL | Status: DC

## 2015-10-22 NOTE — Telephone Encounter (Signed)
Sent meds to [facility]

## 2015-10-22 NOTE — Telephone Encounter (Signed)
Requesting 90 day refills with 4 additional refills on Alendronate 70mg , Amlodine 10mg , Atorvastatin 40mg , Quinapril HCTZ 10-12.5mg  [facility] under her new [employer] Medicare Advantage Plan (ID# [id number])

## 2015-10-26 ENCOUNTER — Ambulatory Visit (INDEPENDENT_AMBULATORY_CARE_PROVIDER_SITE_OTHER): Payer: Medicare Other | Admitting: Family Medicine

## 2015-10-26 ENCOUNTER — Encounter: Payer: Self-pay | Admitting: Family Medicine

## 2015-10-26 VITALS — BP 124/82 | Ht 67.0 in | Wt 197.0 lb

## 2015-10-26 DIAGNOSIS — I10 Essential (primary) hypertension: Secondary | ICD-10-CM

## 2015-10-26 DIAGNOSIS — Z23 Encounter for immunization: Secondary | ICD-10-CM

## 2015-10-26 DIAGNOSIS — E785 Hyperlipidemia, unspecified: Secondary | ICD-10-CM

## 2015-10-26 DIAGNOSIS — J301 Allergic rhinitis due to pollen: Secondary | ICD-10-CM

## 2015-10-26 DIAGNOSIS — K640 First degree hemorrhoids: Secondary | ICD-10-CM

## 2015-10-26 DIAGNOSIS — Z79899 Other long term (current) drug therapy: Secondary | ICD-10-CM | POA: Diagnosis not present

## 2015-10-26 DIAGNOSIS — M810 Age-related osteoporosis without current pathological fracture: Secondary | ICD-10-CM | POA: Diagnosis not present

## 2015-10-26 DIAGNOSIS — H409 Unspecified glaucoma: Secondary | ICD-10-CM

## 2015-10-26 LAB — LIPID PANEL
CHOLESTEROL: 208 mg/dL — AB (ref 125–200)
HDL: 113 mg/dL (ref >=46)
LDL Cholesterol: 82 mg/dL (ref <130)
TRIGLYCERIDES: 66 mg/dL (ref <150)
Total CHOL/HDL Ratio: 1.8 Ratio (ref <=5.0)
VLDL: 13 mg/dL (ref <30)

## 2015-10-26 LAB — CBC WITH DIFFERENTIAL/PLATELET
BASOS PCT: 0 % (ref 0–1)
Basophils Absolute: 0 10*3/uL (ref 0.0–0.1)
EOS ABS: 0.1 10*3/uL (ref 0.0–0.7)
Eosinophils Relative: 2 % (ref 0–5)
HCT: 41.3 % (ref 36.0–46.0)
HEMOGLOBIN: 13.9 g/dL (ref 12.0–15.0)
Lymphocytes Relative: 44 % (ref 12–46)
Lymphs Abs: 2 10*3/uL (ref 0.7–4.0)
MCH: 32 pg (ref 26.0–34.0)
MCHC: 33.7 g/dL (ref 30.0–36.0)
MCV: 95.2 fL (ref 78.0–100.0)
MONOS PCT: 7 % (ref 3–12)
MPV: 9.9 fL (ref 8.6–12.4)
Monocytes Absolute: 0.3 10*3/uL (ref 0.1–1.0)
Neutro Abs: 2.1 10*3/uL (ref 1.7–7.7)
Neutrophils Relative %: 47 % (ref 43–77)
PLATELETS: 203 10*3/uL (ref 150–400)
RBC: 4.34 MIL/uL (ref 3.87–5.11)
RDW: 13.9 % (ref 11.5–15.5)
WBC: 4.5 10*3/uL (ref 4.0–10.5)

## 2015-10-26 LAB — COMPREHENSIVE METABOLIC PANEL
ALBUMIN: 4 g/dL (ref 3.6–5.1)
ALK PHOS: 55 U/L (ref 33–130)
ALT: 7 U/L (ref 6–29)
AST: 21 U/L (ref 10–35)
BILIRUBIN TOTAL: 0.7 mg/dL (ref 0.2–1.2)
BUN: 11 mg/dL (ref 7–25)
CHLORIDE: 106 mmol/L (ref 98–110)
CO2: 26 mmol/L (ref 20–31)
CREATININE: 0.77 mg/dL (ref 0.60–0.93)
Calcium: 9.4 mg/dL (ref 8.6–10.4)
Glucose, Bld: 79 mg/dL (ref 65–99)
POTASSIUM: 3.8 mmol/L (ref 3.5–5.3)
Sodium: 141 mmol/L (ref 135–146)
TOTAL PROTEIN: 6.7 g/dL (ref 6.1–8.1)

## 2015-10-26 NOTE — Patient Instructions (Signed)
  Ms. Kristina Maldonado , Thank you for taking time to come for your Medicare Wellness Visit. I appreciate your ongoing commitment to your health goals. Please review the following plan we discussed and let me know if I can assist you in the future.   These are the goals we discussed:  continue to take good care of herself. No specific goals needed is overall she is doing quite nicely.  This is a list of the screening recommended for you and due dates:  Health Maintenance  Topic Date Due  . Flu Shot  05/23/2016  . Mammogram  02/10/2017  . Colon Cancer Screening  04/23/2018  . Tetanus Vaccine  09/19/2020  . DEXA scan (bone density measurement)  Completed  . Shingles Vaccine  Completed  . Pneumonia vaccines  Completed

## 2015-10-26 NOTE — Progress Notes (Signed)
Kristina Maldonado is a 75 y.o. female who presents for annual wellness visit and follow-up on chronic medical conditions.  She has the following concerns:  She states that the Lipitor does cause a transient burning sensation in her toes. She states this lasts roughly 15 minutes and then goes away. She is followed by Dr. Elmer PickerHecker for her glaucoma. She has an underlying history of hyperlipidemia, hypertension as well as osteoporosis. She has been on a bisphosphonate for approximately 3 years. Her allergies are seasonal. She does occasionally have difficulty with hemorrhoids especially when she gets constipated.  Immunization History  Administered Date(s) Administered  . Influenza Split 08/22/2010, 09/11/2011, 09/03/2012  . Influenza, High Dose Seasonal PF 07/08/2013, 07/14/2014, 09/21/2015  . Pneumococcal Conjugate-13 07/14/2014  . Pneumococcal Polysaccharide-23 07/07/2009  . Td 08/02/1999  . Tdap 09/19/2010  . Zoster 09/24/2006   Last Pap smear: several years ago Last mammogram:02/11/15 Last colonoscopy:05/12/08 Last DEXA:06/05/14 Dentist:- Ophtho:Hecker Exercise: housework and yard work.  Other doctors caring for patient include: Dr. Elmer PickerHecker   Depression screen:  See questionnaire below.  [facility] 10/26/2015 10/21/2014 10/10/2013  Decreased Interest 0 0 0  Down, Depressed, Hopeless 0 0 0  PHQ - 2 Score 0 0 0    Fall Risk Screen: see questionnaire below. Fall Risk  10/26/2015 10/21/2014 10/10/2013 10/07/2012  Falls in the past year? No No No No    ADL screen:  See questionnaire below Functional Status Survey: Is the patient deaf or have difficulty hearing?: No Does the patient have difficulty seeing, even when wearing glasses/contacts?: No Does the patient have difficulty concentrating, remembering, or making decisions?: No Does the patient have difficulty walking or climbing stairs?: No Does the patient have difficulty dressing or bathing?: No Does the patient have  difficulty doing errands alone such as visiting a doctor's office or shopping?: No   End of Life Discussion:  Patient does have a living will and medical power of attorney  Review of Systems Constitutional: -fever, -chills, -sweats, -unexpected weight change, -anorexia, -fatigue Allergy: -sneezing, -itching, -congestion Dermatology: denies changing moles, rash, lumps, new worrisome lesions ENT: -runny nose, -ear pain, -sore throat, -hoarseness, -sinus pain, -teeth pain, -tinnitus, -hearing loss, -epistaxis Cardiology:  -chest pain, -palpitations, -edema, -orthopnea, -paroxysmal nocturnal dyspnea Respiratory: -cough, -shortness of breath, -dyspnea on exertion, -wheezing, -hemoptysis Gastroenterology: -abdominal pain, -nausea, -vomiting, -diarrhea, -constipation, -blood in stool, -changes in bowel movement, -dysphagia Hematology: -bleeding or bruising problems Musculoskeletal: -arthralgias, -myalgias, -joint swelling, -back pain, -neck pain, -cramping, -gait changes Ophthalmology: -vision changes, -eye redness, -itching, -discharge Urology: -dysuria, -difficulty urinating, -hematuria, -urinary frequency, -urgency, incontinence Neurology: -headache, -weakness, -tingling, -numbness, -speech abnormality, -memory loss, -falls, -dizziness Psychology:  -depressed mood, -agitation, -sleep problems    PHYSICAL EXAM:  BP 124/82 mmHg  Ht 5\' 7"  (1.702 m)  Wt 197 lb (89.359 kg)  BMI 30.85 kg/m2  General Appearance: Alert, cooperative, no distress, appears stated age Head: Normocephalic, without obvious abnormality, atraumatic Eyes: PERRL, conjunctiva/corneas clear, EOM's intact, fundi benign Ears: Normal TM's and external ear canals Nose: Nares normal, mucosa normal, no drainage or sinus   tenderness Throat: Lips, mucosa, and tongue normal; teeth and gums normal Neck: Supple, no lymphadenopathy; thyroid: no enlargement/tenderness/nodules; no carotid bruit or JVD Back: Spine nontender, no  curvature, ROM normal, no CVA tenderness Lungs: Clear to auscultation bilaterally without wheezes, rales or ronchi; respirations unlabored Chest Wall: No tenderness or deformity Heart: Regular rate and rhythm, S1 and S2 normal, no murmur, rub or gallop  Abdomen: Soft,  non-tender, nondistended, normoactive bowel sounds, no masses, no hepatosplenomegaly Extremities: No clubbing, cyanosis or edema Pulses: 2+ and symmetric all extremities Skin: Skin color, texture, turgor normal, no rashes or lesions Lymph nodes: Cervical, supraclavicular, and axillary nodes normal Neurologic: CNII-XII intact, normal strength, sensation and gait; reflexes 2+ and symmetric throughout Psych: Normal mood, affect, hygiene and grooming.  ASSESSMENT/PLAN: Encounter Diagnoses  Name Primary?  . Essential hypertension Yes  . Seasonal allergic rhinitis due to pollen   . Encounter for long-term (current) use of medications   . Hyperlipidemia   . Osteoporosis   . Need for pneumococcal vaccine   . Glaucoma   . First degree hemorrhoids       Discussed monthly self breast exams and mammograms; at least 30 minutes of aerobic activity at least 5 days/week and weight-bearing exercise 2x/week; healthy diet, including goals of calcium and vitamin D intake and alcohol recommendations (less than or equal to 1 drink/day) reviewed; regular seatbelt use  Immunization recommendations discussed.   Medicare Attestation I have personally reviewed: The patient's medical and social history Their use of alcohol, tobacco or illicit drugs Their current medications and supplements The patient's functional ability including ADLs,fall risks, home safety risks, cognitive, and hearing and visual impairment Diet and physical activities Evidence for depression or mood disorders   The patient's weight, height, and BMI have been recorded in the chart.  I have made referrals, counseling, and provided education to the patient based on review  of the above and I have provided the patient with a written personalized care plan for preventive services.   She will continue on her present medication regimen. Overall she is doing quite well.   [name], MD   10/26/2015

## 2015-10-27 LAB — VITAMIN D 25 HYDROXY (VIT D DEFICIENCY, FRACTURES): Vit D, 25-Hydroxy: 25 ng/mL — ABNORMAL LOW (ref 30–100)

## 2015-10-28 ENCOUNTER — Telehealth: Payer: Self-pay

## 2015-10-28 NOTE — Telephone Encounter (Signed)
Patient aware of lab results and recommendations. 

## 2016-01-12 ENCOUNTER — Telehealth: Payer: Self-pay | Admitting: Family Medicine

## 2016-01-12 MED ORDER — ALENDRONATE SODIUM 70 MG PO TABS: ORAL_TABLET | ORAL | Status: DC

## 2016-01-12 MED ORDER — AMLODIPINE BESYLATE 10 MG PO TABS: 10.0000 mg | ORAL_TABLET | Freq: Every day | ORAL | Status: DC

## 2016-01-12 MED ORDER — QUINAPRIL-HYDROCHLOROTHIAZIDE 10-12.5 MG PO TABS: 1.0000 | ORAL_TABLET | Freq: Every day | ORAL | Status: DC

## 2016-01-12 MED ORDER — ATORVASTATIN CALCIUM 40 MG PO TABS: 40.0000 mg | ORAL_TABLET | Freq: Every day | ORAL | Status: DC

## 2016-01-12 NOTE — Telephone Encounter (Signed)
[facility]. Patient notified

## 2016-01-12 NOTE — Telephone Encounter (Signed)
Pt called requesting refills on her RX  fosamx,norvasc,lipitor,accuretic, pt [facility] [place] - [id number] and pt can be reached at [phone]

## 2016-01-31 ENCOUNTER — Other Ambulatory Visit: Payer: Self-pay | Admitting: Family Medicine

## 2016-01-31 MED ORDER — ATORVASTATIN CALCIUM 40 MG PO TABS
40.0000 mg | ORAL_TABLET | Freq: Every day | ORAL | Status: DC
Start: 2016-01-31 — End: 2016-10-31

## 2016-01-31 MED ORDER — AMLODIPINE BESYLATE 10 MG PO TABS: 10.0000 mg | ORAL_TABLET | Freq: Every day | ORAL | Status: DC

## 2016-02-14 DIAGNOSIS — Z803 Family history of malignant neoplasm of breast: Secondary | ICD-10-CM | POA: Diagnosis not present

## 2016-02-14 DIAGNOSIS — Z1231 Encounter for screening mammogram for malignant neoplasm of breast: Secondary | ICD-10-CM | POA: Diagnosis not present

## 2016-02-14 LAB — HM MAMMOGRAPHY

## 2016-02-29 DIAGNOSIS — H524 Presbyopia: Secondary | ICD-10-CM | POA: Diagnosis not present

## 2016-02-29 DIAGNOSIS — H40023 Open angle with borderline findings, high risk, bilateral: Secondary | ICD-10-CM | POA: Diagnosis not present

## 2016-02-29 DIAGNOSIS — H1013 Acute atopic conjunctivitis, bilateral: Secondary | ICD-10-CM | POA: Diagnosis not present

## 2016-03-11 ENCOUNTER — Encounter (HOSPITAL_COMMUNITY): Payer: Self-pay | Admitting: Emergency Medicine

## 2016-03-11 ENCOUNTER — Observation Stay (HOSPITAL_COMMUNITY)
Admission: EM | Admit: 2016-03-11 | Discharge: 2016-03-13 | Disposition: A | Discharge status: Home / Self Care | Payer: Medicare Other | Attending: Family Medicine | Admitting: Family Medicine

## 2016-03-11 ENCOUNTER — Emergency Department (HOSPITAL_COMMUNITY): Payer: Medicare Other

## 2016-03-11 DIAGNOSIS — Z7983 Long term (current) use of bisphosphonates: Secondary | ICD-10-CM | POA: Diagnosis not present

## 2016-03-11 DIAGNOSIS — Z780 Asymptomatic menopausal state: Secondary | ICD-10-CM | POA: Diagnosis present

## 2016-03-11 DIAGNOSIS — R110 Nausea: Secondary | ICD-10-CM | POA: Diagnosis not present

## 2016-03-11 DIAGNOSIS — M810 Age-related osteoporosis without current pathological fracture: Secondary | ICD-10-CM | POA: Insufficient documentation

## 2016-03-11 DIAGNOSIS — E669 Obesity, unspecified: Secondary | ICD-10-CM | POA: Insufficient documentation

## 2016-03-11 DIAGNOSIS — R61 Generalized hyperhidrosis: Secondary | ICD-10-CM | POA: Diagnosis not present

## 2016-03-11 DIAGNOSIS — R42 Dizziness and giddiness: Secondary | ICD-10-CM | POA: Diagnosis not present

## 2016-03-11 DIAGNOSIS — R404 Transient alteration of awareness: Secondary | ICD-10-CM | POA: Diagnosis not present

## 2016-03-11 DIAGNOSIS — Z79899 Other long term (current) drug therapy: Secondary | ICD-10-CM | POA: Insufficient documentation

## 2016-03-11 DIAGNOSIS — I10 Essential (primary) hypertension: Secondary | ICD-10-CM | POA: Diagnosis present

## 2016-03-11 DIAGNOSIS — E785 Hyperlipidemia, unspecified: Secondary | ICD-10-CM | POA: Diagnosis present

## 2016-03-11 DIAGNOSIS — R739 Hyperglycemia, unspecified: Secondary | ICD-10-CM | POA: Diagnosis not present

## 2016-03-11 DIAGNOSIS — R51 Headache: Secondary | ICD-10-CM | POA: Diagnosis not present

## 2016-03-11 DIAGNOSIS — E876 Hypokalemia: Secondary | ICD-10-CM | POA: Diagnosis not present

## 2016-03-11 DIAGNOSIS — Z6831 Body mass index (BMI) 31.0-31.9, adult: Secondary | ICD-10-CM | POA: Diagnosis not present

## 2016-03-11 DIAGNOSIS — R531 Weakness: Secondary | ICD-10-CM | POA: Diagnosis not present

## 2016-03-11 DIAGNOSIS — Z7982 Long term (current) use of aspirin: Secondary | ICD-10-CM | POA: Diagnosis not present

## 2016-03-11 LAB — CBC WITH DIFFERENTIAL/PLATELET
Basophils Absolute: 0 K/uL (ref 0.0–0.1)
Basophils Relative: 0 %
Eosinophils Absolute: 0.1 K/uL (ref 0.0–0.7)
Eosinophils Relative: 1 %
HCT: 39.5 % (ref 36.0–46.0)
Hemoglobin: 13.1 g/dL (ref 12.0–15.0)
Lymphocytes Relative: 27 %
Lymphs Abs: 2 K/uL (ref 0.7–4.0)
MCH: 30.8 pg (ref 26.0–34.0)
MCHC: 33.2 g/dL (ref 30.0–36.0)
MCV: 92.7 fL (ref 78.0–100.0)
Monocytes Absolute: 0.5 K/uL (ref 0.1–1.0)
Monocytes Relative: 6 %
Neutro Abs: 4.9 K/uL (ref 1.7–7.7)
Neutrophils Relative %: 66 %
Platelets: 189 K/uL (ref 150–400)
RBC: 4.26 MIL/uL (ref 3.87–5.11)
RDW: 13.3 % (ref 11.5–15.5)
WBC: 7.4 K/uL (ref 4.0–10.5)

## 2016-03-11 LAB — COMPREHENSIVE METABOLIC PANEL
ALT: 10 U/L — AB (ref 14–54)
AST: 22 U/L (ref 15–41)
Albumin: 3.8 g/dL (ref 3.5–5.0)
Alkaline Phosphatase: 52 U/L (ref 38–126)
Anion gap: 13 (ref 5–15)
BUN: 12 mg/dL (ref 6–20)
CHLORIDE: 104 mmol/L (ref 101–111)
CO2: 26 mmol/L (ref 22–32)
CREATININE: 0.99 mg/dL (ref 0.44–1.00)
Calcium: 9.6 mg/dL (ref 8.9–10.3)
GFR calc Af Amer: >60 mL/min (ref >60)
GFR, EST NON AFRICAN AMERICAN: 56 mL/min — AB (ref >60)
GLUCOSE: 175 mg/dL — AB (ref 65–99)
Potassium: 2.9 mmol/L — ABNORMAL LOW (ref 3.5–5.1)
SODIUM: 143 mmol/L (ref 135–145)
Total Bilirubin: 0.6 mg/dL (ref 0.3–1.2)
Total Protein: 7.1 g/dL (ref 6.5–8.1)

## 2016-03-11 LAB — URINALYSIS, ROUTINE W REFLEX MICROSCOPIC
BILIRUBIN URINE: NEGATIVE
GLUCOSE, UA: NEGATIVE mg/dL
Hgb urine dipstick: NEGATIVE
KETONES UR: NEGATIVE mg/dL
Leukocytes, UA: NEGATIVE
Nitrite: NEGATIVE
PH: 7 (ref 5.0–8.0)
Protein, ur: NEGATIVE mg/dL
SPECIFIC GRAVITY, URINE: 1.013 (ref 1.005–1.030)

## 2016-03-11 LAB — BASIC METABOLIC PANEL
ANION GAP: 7 (ref 5–15)
BUN: 9 mg/dL (ref 6–20)
CHLORIDE: 107 mmol/L (ref 101–111)
CO2: 26 mmol/L (ref 22–32)
Calcium: 9.2 mg/dL (ref 8.9–10.3)
Creatinine, Ser: 0.83 mg/dL (ref 0.44–1.00)
Glucose, Bld: 115 mg/dL — ABNORMAL HIGH (ref 65–99)
POTASSIUM: 3.5 mmol/L (ref 3.5–5.1)
SODIUM: 140 mmol/L (ref 135–145)

## 2016-03-11 LAB — TSH: TSH: 0.467 u[IU]/mL (ref 0.350–4.500)

## 2016-03-11 LAB — TROPONIN I: Troponin I: <0.03 ng/mL

## 2016-03-11 LAB — LIPASE, BLOOD: Lipase: 25 U/L (ref 11–51)

## 2016-03-11 LAB — MAGNESIUM: Magnesium: 1.9 mg/dL (ref 1.7–2.4)

## 2016-03-11 MED ORDER — SENNOSIDES-DOCUSATE SODIUM 8.6-50 MG PO TABS: 1.0000 | ORAL_TABLET | Freq: Every evening | ORAL | Status: DC | PRN

## 2016-03-11 MED ORDER — PROCHLORPERAZINE EDISYLATE 5 MG/ML IJ SOLN
10.0000 mg | Freq: Four times a day (QID) | INTRAMUSCULAR | Status: DC | PRN
  Administered 2016-03-11: 10 mg via INTRAVENOUS
  Filled 2016-03-11 (×3): qty 2

## 2016-03-11 MED ORDER — ONDANSETRON HCL 4 MG/2ML IJ SOLN: 4.0000 mg | Freq: Four times a day (QID) | INTRAMUSCULAR | Status: DC | PRN

## 2016-03-11 MED ORDER — MAGNESIUM CITRATE PO SOLN: 1.0000 | Freq: Once | ORAL | Status: DC | PRN

## 2016-03-11 MED ORDER — ONDANSETRON HCL 4 MG/2ML IJ SOLN
4.0000 mg | Freq: Once | INTRAMUSCULAR | Status: AC
  Administered 2016-03-11: 4 mg via INTRAVENOUS
  Filled 2016-03-11: qty 2

## 2016-03-11 MED ORDER — ACETAMINOPHEN 325 MG PO TABS: 650.0000 mg | ORAL_TABLET | Freq: Four times a day (QID) | ORAL | Status: DC | PRN

## 2016-03-11 MED ORDER — TRAZODONE HCL 50 MG PO TABS: 25.0000 mg | ORAL_TABLET | Freq: Every evening | ORAL | Status: DC | PRN

## 2016-03-11 MED ORDER — ONDANSETRON HCL 4 MG PO TABS: 4.0000 mg | ORAL_TABLET | Freq: Four times a day (QID) | ORAL | Status: DC | PRN

## 2016-03-11 MED ORDER — HYDROCODONE-ACETAMINOPHEN 5-325 MG PO TABS: 1.0000 | ORAL_TABLET | ORAL | Status: DC | PRN

## 2016-03-11 MED ORDER — ASPIRIN EC 81 MG PO TBEC
81.0000 mg | DELAYED_RELEASE_TABLET | Freq: Every day | ORAL | Status: DC
  Administered 2016-03-12 – 2016-03-13 (×2): 81 mg via ORAL
  Filled 2016-03-11 (×2): qty 1

## 2016-03-11 MED ORDER — ACETAMINOPHEN 650 MG RE SUPP: 650.0000 mg | Freq: Four times a day (QID) | RECTAL | Status: DC | PRN

## 2016-03-11 MED ORDER — BISACODYL 10 MG RE SUPP: 10.0000 mg | Freq: Every day | RECTAL | Status: DC | PRN

## 2016-03-11 MED ORDER — AMLODIPINE BESYLATE 10 MG PO TABS
10.0000 mg | ORAL_TABLET | Freq: Every day | ORAL | Status: DC
  Administered 2016-03-11 – 2016-03-13 (×3): 10 mg via ORAL
  Filled 2016-03-11 (×2): qty 1
  Filled 2016-03-11: qty 2

## 2016-03-11 MED ORDER — SODIUM CHLORIDE 0.9 % IV BOLUS (SEPSIS)
1000.0000 mL | Freq: Once | INTRAVENOUS | Status: AC
  Administered 2016-03-11: 1000 mL via INTRAVENOUS

## 2016-03-11 MED ORDER — SODIUM CHLORIDE 0.9% FLUSH
3.0000 mL | Freq: Two times a day (BID) | INTRAVENOUS | Status: DC
  Administered 2016-03-11 – 2016-03-13 (×4): 3 mL via INTRAVENOUS

## 2016-03-11 MED ORDER — MECLIZINE HCL 25 MG PO TABS
25.0000 mg | ORAL_TABLET | Freq: Once | ORAL | Status: AC
  Administered 2016-03-11: 25 mg via ORAL
  Filled 2016-03-11: qty 1

## 2016-03-11 MED ORDER — MECLIZINE HCL 25 MG PO TABS
12.5000 mg | ORAL_TABLET | Freq: Three times a day (TID) | ORAL | Status: DC | PRN
  Administered 2016-03-11: 12.5 mg via ORAL
  Filled 2016-03-11: qty 1

## 2016-03-11 MED ORDER — SODIUM CHLORIDE 0.9 % IV SOLN
INTRAVENOUS | Status: DC
  Administered 2016-03-11 – 2016-03-12 (×2): via INTRAVENOUS

## 2016-03-11 MED ORDER — ONDANSETRON HCL 4 MG/2ML IJ SOLN
4.0000 mg | Freq: Three times a day (TID) | INTRAMUSCULAR | Status: DC | PRN
  Administered 2016-03-11: 4 mg via INTRAVENOUS
  Filled 2016-03-11: qty 2

## 2016-03-11 MED ORDER — ATORVASTATIN CALCIUM 40 MG PO TABS
40.0000 mg | ORAL_TABLET | Freq: Every day | ORAL | Status: DC
  Administered 2016-03-12 – 2016-03-13 (×2): 40 mg via ORAL
  Filled 2016-03-11 (×2): qty 1

## 2016-03-11 NOTE — Progress Notes (Signed)
Pt still unable to raise her head without nausea and dizziness. Zofran ineffective. Will try Compazine

## 2016-03-11 NOTE — ED Provider Notes (Signed)
Patient care assumed from Marlon Peliffany Greene, PA-C. Please see her note for further detail. Patient in emergency department for evaluation of dizziness. Plan for follow-up on screening labs, chest x-ray, CT scan. Treat dizziness with meclizine, reevaluate. If patient feels better and negative CT with another objective findings, plan to DC home. If findings are evident or patient is unable to ambulate, plan for MRI and possible admission. Discussed with Dr. Bebe ShaggyWickline.  Screening labs are grossly unremarkable. CT head negative for any acute intracranial abnormality. However, patient states she is still dizzy and is unable to walk at this time. Plan for medical admission. Discussed with hospitalist, Marlowe KaysSara Wertman, PA-C, will see in emergency department. Patient admitted. Filed Vitals:   03/11/16 1000 03/11/16 1130 03/11/16 1215 03/11/16 1241  BP: 116/76 136/85 144/85 136/75  Pulse: 76 73 79 76  Temp:    97.6 F (36.4 C)  TempSrc:    Oral  Resp: 16 17 13 16   Height:    5\' 6"  (1.676 m)  Weight:    85.1 kg  SpO2: 100% 100% 100% 100%   Meds given in ED:  Medications  amLODipine (NORVASC) tablet 10 mg (10 mg Oral Given 03/11/16 1106)  atorvastatin (LIPITOR) tablet 40 mg (not administered)  aspirin EC tablet 81 mg (not administered)  0.9 %  sodium chloride infusion ( Intravenous Transfusing/Transfer 03/11/16 1220)  acetaminophen (TYLENOL) tablet 650 mg (not administered)    Or  acetaminophen (TYLENOL) suppository 650 mg (not administered)  HYDROcodone-acetaminophen (NORCO/VICODIN) 5-325 MG per tablet 1-2 tablet (not administered)  traZODone (DESYREL) tablet 25 mg (not administered)  senna-docusate (Senokot-S) tablet 1 tablet (not administered)  bisacodyl (DULCOLAX) suppository 10 mg (not administered)  sodium chloride flush (NS) 0.9 % injection 3 mL (not administered)  magnesium citrate solution 1 Bottle (not administered)  ondansetron (ZOFRAN) injection 4 mg (not administered)  meclizine  (ANTIVERT) tablet 12.5 mg (not administered)  sodium chloride 0.9 % bolus 1,000 mL (0 mLs Intravenous Stopped 03/11/16 0812)  meclizine (ANTIVERT) tablet 25 mg (25 mg Oral Given 03/11/16 0603)  ondansetron (ZOFRAN) injection 4 mg (4 mg Intravenous Given 03/11/16 0810)    Current Discharge Medication List       Joycie PeekBenjamin Bo Rogue, PA-C 03/11/16 1500  Zadie Rhineonald Wickline, MD 03/12/16 0002

## 2016-03-11 NOTE — ED Provider Notes (Signed)
Patient seen/examined in the Emergency Department in conjunction with Midlevel Provider Neva SeatGreene Patient reports waking up feeling dizzy and having nausea  Exam : awake/alert, appears uncomfortable but no focal neuro deficits while lying in the bed Plan: neuro-imaging recommended.  If patient is unable to ambulate may need MRI and admission  tPA in stroke considered but not given due to: Onset over 3-4.5hours (pt woke up with symptoms)    Zadie Rhineonald Kasidee Voisin, MD 03/11/16 [phone]

## 2016-03-11 NOTE — ED Notes (Signed)
Pt unable to stand for orthostatic VS .

## 2016-03-11 NOTE — H&P (Signed)
History and Physical    Kristina Maldonado ZOX:[id number] DOB: 1941-06-19 DOA: 03/11/2016   PCP: [name], MD   Patient coming from:  Home    Chief Complaint: Dizziness  HPI: Kristina Maldonado is a 75 y.o. female with medical history significant for HTN, HLD, osteoporosis, glaucoma, presenting to the emergency department for evaluation of dizziness and nausea.She reports these symptoms waking her up around 4 am  as she was to go to the bathroom. At that time, she felt very dizzy, with a decrease in balance. She denies any vision changes. She denies any headaches. No dysarthria or dysphagia. Denies any unilateral weakness or sensory deficiencies. Denies any confusion. Denies any chest pain, or shortness of breath. Denies any fever or chills, or night sweats. Denies any abdominal pain, or diarrhea. She denies any sick contacts or new foods.Denies any recent long distance trips. No recent surgeries. Denies lower extremity swelling.  Denies abnormal skin rashes, or neuropathy.  She is compliant with her medications. She was last seen Normal around 10 PM.   ED Course: she received IV Zofran, meclizine with some improvement in her symptoms, but she continues to be unable to ambulate without becoming dizzy.  BP 116/76 mmHg  Pulse 76  Resp 16  SpO2 100% blood sugar is 175. CT of the head is negative for acute intracranial abnormalities. No TPA was received. Tn negative. CBC is normal. CMET remarkable for K 2.9. EKG Sinus arrhythmia Borderline left axis deviation Borderline abnrm T, anterolateral leads Prolonged QT interval 606 (no prior EkG available)  She will be admitted for observation and management of her symptoms.  Review of Systems: As per HPI otherwise 10 point review of systems negative.   Past Medical History  Diagnosis Date  . Hypertension   . Obesity   . Dyslipidemia   . Osteopenia   . Allergy   . Hyperlipidemia     Past Surgical History  Procedure Laterality Date  .  Colonoscopy  2009    PERRY  . Abdominal hysterectomy       reports that she has never smoked. She has never used smokeless tobacco. She reports that she does not drink alcohol or use illicit drugs. Walks unassisted  No Known Allergies  No family history on file.    Prior to Admission medications   Medication Sig Start Date End Date Taking? Authorizing Provider  alendronate (FOSAMAX) 70 MG tablet TAKE 1 TABLET EVERY 7 DAYS WITH A FULL GLASS OF WATER ON AN EMPTY STOMACH 01/12/16  Yes Ronnald Nian, MD  amLODipine (NORVASC) 10 MG tablet Take 1 tablet (10 mg total) by mouth daily. 01/31/16  Yes Ronnald Nian, MD  aspirin 81 MG tablet Take 81 mg by mouth daily.     Yes Historical Provider, MD  atorvastatin (LIPITOR) 40 MG tablet Take 1 tablet (40 mg total) by mouth daily. 01/31/16  Yes Ronnald Nian, MD  Multiple Vitamins-Minerals (MULTIVITAMIN WITH MINERALS) tablet Take 1 tablet by mouth daily.     Yes Historical Provider, MD  quinapril-hydrochlorothiazide (ACCURETIC) 10-12.5 MG tablet Take 1 tablet by mouth daily. 01/12/16  Yes Ronnald Nian, MD    Physical Exam:    Filed Vitals:   03/11/16 0800 03/11/16 0830 03/11/16 0915 03/11/16 1000  BP: 138/86 117/67 113/63 116/76  Pulse: 73 75 72 76  Resp: SpO2: 98% 99% 98% 100%      Constitutional: NAD, calm, uncomfortable due to dizziness  Filed Vitals:  03/11/16 0800 03/11/16 0830 03/11/16 0915 03/11/16 1000  BP: 138/86 117/67 113/63 116/76  Pulse: 73 75 72 76  Resp: 15 15 14 16   SpO2: 98% 99% 98% 100%   Eyes: PERRL, lids and conjunctivae normal. She has glaucoma ENMT: Mucous membranes are moist. Posterior pharynx clear of any exudate or lesions.Normal dentition.  Neck: normal, supple, no masses, no thyromegaly Respiratory: clear to auscultation bilaterally, no wheezing, no crackles. Normal respiratory effort. No accessory muscle use.  Cardiovascular: Regular rate and rhythm, no murmurs / rubs / gallops. No  extremity edema. 2+ pedal pulses. No carotid bruits.  Abdomen: no tenderness, no masses palpated. No hepatosplenomegaly. Bowel sounds positive.  Musculoskeletal: no clubbing / cyanosis. No joint deformity upper and lower extremities. Good ROM, no contractures. Normal muscle tone.  Skin: no rashes, lesions, ulcers. No induration Neurologic: CN 2-12 grossly intact. Sensation intact, DTR normal. Strength 5/5 in all 4.  Psychiatric: Normal judgment and insight. Alert and oriented x 3. Normal mood.     Labs on Admission: I have personally reviewed following labs and imaging studies  CBC:  Recent Labs Lab 03/11/16 0431  WBC 7.4  NEUTROABS 4.9  HGB 13.1  HCT 39.5  MCV 92.7  PLT 189    Basic Metabolic Panel:  Recent Labs Lab 03/11/16 0431  NA 143  K 2.9*  CL 104  CO2 26  GLUCOSE 175*  BUN 12  CREATININE 0.99  CALCIUM 9.6    GFR: CrCl cannot be calculated (Unknown ideal weight.).  Liver Function Tests:  Recent Labs Lab 03/11/16 0431  AST 22  ALT 10*  ALKPHOS 52  BILITOT 0.6  PROT 7.1  ALBUMIN 3.8    Recent Labs Lab 03/11/16 0431  LIPASE 25   No results for input(s): AMMONIA in the last 168 hours.  Coagulation Profile: No results for input(s): INR, PROTIME in the last 168 hours.  Cardiac Enzymes:  Recent Labs Lab 03/11/16 0431  TROPONINI <0.03    BNP (last 3 results) No results for input(s): PROBNP in the last 8760 hours.  HbA1C: No results for input(s): HGBA1C in the last 72 hours.  CBG: No results for input(s): GLUCAP in the last 168 hours.  Lipid Profile: No results for input(s): CHOL, HDL, LDLCALC, TRIG, CHOLHDL, LDLDIRECT in the last 72 hours.  Thyroid Function Tests: No results for input(s): TSH, T4TOTAL, FREET4, T3FREE, THYROIDAB in the last 72 hours.  Anemia Panel: No results for input(s): VITAMINB12, FOLATE, FERRITIN, TIBC, IRON, RETICCTPCT in the last 72 hours.  Urine analysis:    Component Value Date/Time   COLORURINE  YELLOW 03/11/2016 0541   APPEARANCEUR CLEAR 03/11/2016 0541   LABSPEC 1.013 03/11/2016 0541   PHURINE 7.0 03/11/2016 0541   GLUCOSEU NEGATIVE 03/11/2016 0541   HGBUR NEGATIVE 03/11/2016 0541   BILIRUBINUR NEGATIVE 03/11/2016 0541   BILIRUBINUR n 10/21/2014 0919   KETONESUR NEGATIVE 03/11/2016 0541   PROTEINUR NEGATIVE 03/11/2016 0541   PROTEINUR n 10/21/2014 0919   UROBILINOGEN negative 10/21/2014 0919   NITRITE NEGATIVE 03/11/2016 0541   NITRITE n 10/21/2014 0919   LEUKOCYTESUR NEGATIVE 03/11/2016 0541    Sepsis Labs: @LABRCNTIP (procalcitonin:4,lacticidven:4) )No results found for this or any previous visit (from the past 240 hour(s)).   Radiological Exams on Admission: Dg Chest 1 View  03/11/2016  CLINICAL DATA:  Patient complaining of nausea, weakness and diaphoresis for 1 day. History of hypertension. EXAM: CHEST 1 VIEW COMPARISON:  None. FINDINGS: Cardiac silhouette is top-normal in size. No mediastinal or hilar masses or  evidence of adenopathy. Prominent bronchovascular markings. No evidence pulmonary edema or pneumonia. No pleural effusion or pneumothorax. Bony thorax is demineralized but grossly intact. IMPRESSION: No acute cardiopulmonary disease. Electronically Signed   By: Amie Portland M.D.   On: 03/11/2016 07:12   Ct Head Wo Contrast  03/11/2016  CLINICAL DATA:  Headache all over.  Nausea and vomiting. EXAM: CT HEAD WITHOUT CONTRAST TECHNIQUE: Contiguous axial images were obtained from the base of the skull through the vertex without intravenous contrast. COMPARISON:  None. FINDINGS: The ventricles are normal in size and configuration. There are no parenchymal masses or mass effect, no evidence of an infarct, no extra-axial masses or abnormal fluid collections no intracranial hemorrhage. Mild left maxillary sinus mucosal thickening. Remaining sinuses are clear as are the mastoid air cells. No skull lesion. IMPRESSION: 1. No intracranial abnormality. Electronically Signed   By:  Amie Portland M.D.   On: 03/11/2016 07:15    EKG: Independently reviewed.  Assessment/Plan Active Problems:   Osteoporosis   Hypertension   Hyperlipidemia   Dizziness, nonspecific   Hypokalemia   Dizziness: Etiology unknown at this time, neuro versus cardiac vs BPV. CT head  And CXR negative. She is dizzy,unable to stand due to gait instability .- Telemetry, observation - Meclizine when necessary dizziness, Zofran for nausea prn  may need MRI brain to further delineate - Neurology consult if not improving in a.m.  Abnormal EKG. EKG abnormal with  Sinus arrhythmia Borderline left axis deviation Borderline abnrm T, anterolateral leads Prolonged QT interval 606 (no prior EkG available) Tn negative. Hold diuretic for now. Denies CP or palpitations - orthostatics Repeat EKG Serial Tn 2 D echo TSH continue ASA Continue preadmission meds if BP permits  Statins  May consider Cards evaluation pending on the results   Hypertension BP 116/76 mmHg  Pulse 76  Resp 16  SpO2 100% Controlled Continue home anti-hypertensive medications.    Hyperlipidemia Continue home statins  Hypokalemia, may be due to diuretics. Abnormal EKG as above Current K 2.9 Hold diuretic Recheck K if abnormal, will need  replenishment  Check Mg  Hyperglycemia No prior Hb A1C for review. Will check   DVT prophylaxis: SCDs until MRI returns negative for any intracranial bleeding, then can change to Lovenox Code Status:   Full  Family Communication:  Discussed with husband  Disposition Plan: Expect patient to be discharged to home after condition improves Consults called:    None Admission status: Obs Tele    Daira Hine E, PA-C [facility]   If 7PM-7AM, please contact night-coverage [email or web address] Password [email or web address]  03/11/2016, 10:16 AM

## 2016-03-11 NOTE — ED Notes (Signed)
Pt reports that she woke up and was nauseas.  She began vomiting and diaphoretic.  EMS stated that she had a near syncopal episode when they tried to stand her up.  She became much more diaphoretic.  She was weak, burt denies any pain or deficits.  She does not remember what she ate after lunch.  She was given 4mg  of zofran but continued to vomit.

## 2016-03-11 NOTE — ED Provider Notes (Signed)
CSN: [id number]     Arrival date & time 03/11/16  0335 History   First MD Initiated Contact with Patient 03/11/16 0454     Chief Complaint  Patient presents with  . Emesis  . Weakness  . Abdominal Pain     (Consider location/radiation/quality/duration/timing/severity/associated sxs/prior Treatment) HPI   Patient with PMH of hypertension, obesity, dyslipidemia, osteopenia, allergies and hyperlipidemia comes to the ER with complaints of nausea. SHe reports waking up feeling really dizzy. She got out of bed and drank some orange juice which made her feel dizzy and have upset stomach. She was having to use the wall from her bed to her refrigerator. She used the wall for support. Pt denies EVER having any diaphoresis. She continues to feel "bad" and subjectively globally weak. Never has had vertigo before. No focal neuro deficits.  She lives alone and therefore pushed her home alarm system for help. When EMS arrived she had a near syncopal episode when they attempted to stand her up. She endorses feeling weak but denies having any pain. No CP or SOB.  Patient is also having vomiting.  PCP: [name], MD Kristina Maldonado is a 75 y.o.  female  ROS: The patient denies  fever, headache, weakness (general or focal), confusion, change of vision,  dysphagia, aphagia, shortness of breath,  abdominal pains, diarrhea, lower extremity swelling, rash, neck pain, chest pain   Past Medical History  Diagnosis Date  . Hypertension   . Obesity   . Dyslipidemia   . Osteopenia   . Allergy   . Hyperlipidemia    Past Surgical History  Procedure Laterality Date  . Colonoscopy  2009    PERRY  . Abdominal hysterectomy     No family history on file. Social History  Substance Use Topics  . Smoking status: Never Smoker   . Smokeless tobacco: Never Used  . Alcohol Use: No   OB History    No data available     Review of Systems  Review of Systems All other systems negative except as  documented in the HPI. All pertinent positives and negatives as reviewed in the HPI.   Allergies  Review of patient's allergies indicates no known allergies.  Home Medications   Prior to Admission medications   Medication Sig Start Date End Date Taking? Authorizing Provider  alendronate (FOSAMAX) 70 MG tablet TAKE 1 TABLET EVERY 7 DAYS WITH A FULL GLASS OF WATER ON AN EMPTY STOMACH 01/12/16  Yes Ronnald Nian, MD  amLODipine (NORVASC) 10 MG tablet Take 1 tablet (10 mg total) by mouth daily. 01/31/16  Yes Ronnald Nian, MD  aspirin 81 MG tablet Take 81 mg by mouth daily.     Yes Historical Provider, MD  atorvastatin (LIPITOR) 40 MG tablet Take 1 tablet (40 mg total) by mouth daily. 01/31/16  Yes Ronnald Nian, MD  Multiple Vitamins-Minerals (MULTIVITAMIN WITH MINERALS) tablet Take 1 tablet by mouth daily.     Yes Historical Provider, MD  quinapril-hydrochlorothiazide (ACCURETIC) 10-12.5 MG tablet Take 1 tablet by mouth daily. 01/12/16  Yes Ronnald Nian, MD   BP 130/78 mmHg  Pulse 67  Resp 12  SpO2 99% Physical Exam  Constitutional: She appears well-developed and well-nourished. No distress.  HENT:  Head: Normocephalic and atraumatic.  Right Ear: Tympanic membrane and ear canal normal.  Left Ear: Tympanic membrane and ear canal normal.  Eyes: Pupils are equal, round, and reactive to light.  Neck: Normal range of motion. Neck supple.  Cardiovascular: Normal rate and regular rhythm.   Pulmonary/Chest: Effort normal.  Abdominal: Soft.  Neurological: She is alert.  Cranial nerves grossly intact on exam. Pt alert and oriented x 3 Upper and lower extremity strength is symmetrical and physiologic Normal muscular tone No facial droop Coordination intact, no limb ataxia   Skin: Skin is warm and dry.  Nursing note and vitals reviewed.   ED Course  Procedures (including critical care time) Labs Review Labs Reviewed  COMPREHENSIVE METABOLIC PANEL - Abnormal; Notable for the  following:    Potassium 2.9 (*)    Glucose, Bld 175 (*)    ALT 10 (*)    GFR calc non Af Amer 56 (*)    All other components within normal limits  CBC WITH DIFFERENTIAL/PLATELET  LIPASE, BLOOD  TROPONIN I  URINALYSIS, ROUTINE W REFLEX MICROSCOPIC (NOT [facility])    Imaging Review No results found. I have personally reviewed and evaluated these images and lab results as part of my medical decision-making.   EKG Interpretation   Date/Time:  Saturday Mar 11 2016 03:42:21 EDT Ventricular Rate:  81 PR Interval:  172 QRS Duration: 105 QT Interval:  522 QTC Calculation: 606 R Axis:   -15 Text Interpretation:  Sinus arrhythmia Borderline left axis deviation  Borderline abnrm T, anterolateral leads Prolonged QT interval Abnormal ekg  No previous ECGs available Confirmed by Bebe ShaggyWICKLINE  MD, Dorinda HillNALD ([id number]) on  03/11/2016 4:26:13 AM   MDM   Final diagnoses:  None   Patient with acute dizziness but no neuro deficits on exam noted. @ 5:31 am her EKG, troponin, lipase, CBC are unremarkable. She has an abnormal chemistry panel with potassium of 2.9 and glucose of 175. One liter normal saline ordered.  UA, Head CT and chest xray pending. Stroke swallow screen ordered. If head CT is normal and patient is feeling better she can go home.  If these are normal pt is still symptomatic she will need MRI. At end of shift patient sign out to [facility] Cartner, PA-C  Kristina Peliffany Shayma Pfefferle, PA-C 03/11/16 0600  Kristina Rhineonald Wickline, MD 03/11/16 [phone]

## 2016-03-12 ENCOUNTER — Observation Stay (HOSPITAL_COMMUNITY): Payer: Medicare Other

## 2016-03-12 ENCOUNTER — Other Ambulatory Visit (HOSPITAL_COMMUNITY): Payer: Medicare Other

## 2016-03-12 ENCOUNTER — Observation Stay (HOSPITAL_BASED_OUTPATIENT_CLINIC_OR_DEPARTMENT_OTHER): Payer: Medicare Other

## 2016-03-12 DIAGNOSIS — E785 Hyperlipidemia, unspecified: Secondary | ICD-10-CM | POA: Diagnosis not present

## 2016-03-12 DIAGNOSIS — E876 Hypokalemia: Secondary | ICD-10-CM | POA: Diagnosis not present

## 2016-03-12 DIAGNOSIS — R9431 Abnormal electrocardiogram [ECG] [EKG]: Secondary | ICD-10-CM | POA: Diagnosis not present

## 2016-03-12 DIAGNOSIS — I10 Essential (primary) hypertension: Secondary | ICD-10-CM | POA: Diagnosis not present

## 2016-03-12 DIAGNOSIS — R42 Dizziness and giddiness: Secondary | ICD-10-CM | POA: Diagnosis not present

## 2016-03-12 LAB — CBC
HEMATOCRIT: 39 % (ref 36.0–46.0)
Hemoglobin: 13.2 g/dL (ref 12.0–15.0)
MCH: 31.3 pg (ref 26.0–34.0)
MCHC: 33.8 g/dL (ref 30.0–36.0)
MCV: 92.4 fL (ref 78.0–100.0)
Platelets: 167 10*3/uL (ref 150–400)
RBC: 4.22 MIL/uL (ref 3.87–5.11)
RDW: 13.4 % (ref 11.5–15.5)
WBC: 6.3 10*3/uL (ref 4.0–10.5)

## 2016-03-12 LAB — COMPREHENSIVE METABOLIC PANEL
ALT: 9 U/L — ABNORMAL LOW (ref 14–54)
ANION GAP: 10 (ref 5–15)
AST: 19 U/L (ref 15–41)
Albumin: 3.2 g/dL — ABNORMAL LOW (ref 3.5–5.0)
Alkaline Phosphatase: 50 U/L (ref 38–126)
BILIRUBIN TOTAL: 0.7 mg/dL (ref 0.3–1.2)
BUN: 5 mg/dL — ABNORMAL LOW (ref 6–20)
CO2: 24 mmol/L (ref 22–32)
Calcium: 8.7 mg/dL — ABNORMAL LOW (ref 8.9–10.3)
Chloride: 107 mmol/L (ref 101–111)
Creatinine, Ser: 0.82 mg/dL (ref 0.44–1.00)
Glucose, Bld: 93 mg/dL (ref 65–99)
POTASSIUM: 3.3 mmol/L — AB (ref 3.5–5.1)
Sodium: 141 mmol/L (ref 135–145)
TOTAL PROTEIN: 6.5 g/dL (ref 6.5–8.1)

## 2016-03-12 LAB — ECHOCARDIOGRAM COMPLETE
HEIGHTINCHES: 66 in
WEIGHTICAEL: 3107.6 [oz_av]

## 2016-03-12 MED ORDER — QUINAPRIL-HYDROCHLOROTHIAZIDE 10-12.5 MG PO TABS: 1.0000 | ORAL_TABLET | Freq: Every day | ORAL | Status: DC

## 2016-03-12 MED ORDER — HYDROCHLOROTHIAZIDE 12.5 MG PO CAPS
12.5000 mg | ORAL_CAPSULE | Freq: Every day | ORAL | Status: DC
  Administered 2016-03-12 – 2016-03-13 (×2): 12.5 mg via ORAL
  Filled 2016-03-12 (×3): qty 1

## 2016-03-12 MED ORDER — POTASSIUM CHLORIDE CRYS ER 20 MEQ PO TBCR
40.0000 meq | EXTENDED_RELEASE_TABLET | Freq: Once | ORAL | Status: AC
  Administered 2016-03-12: 40 meq via ORAL
  Filled 2016-03-12: qty 2

## 2016-03-12 MED ORDER — LISINOPRIL 10 MG PO TABS
10.0000 mg | ORAL_TABLET | Freq: Every day | ORAL | Status: DC
  Administered 2016-03-12 – 2016-03-13 (×2): 10 mg via ORAL
  Filled 2016-03-12 (×2): qty 1

## 2016-03-12 MED ORDER — ENOXAPARIN SODIUM 40 MG/0.4ML ~~LOC~~ SOLN
40.0000 mg | SUBCUTANEOUS | Status: DC
  Administered 2016-03-12: 40 mg via SUBCUTANEOUS
  Filled 2016-03-12: qty 0.4

## 2016-03-12 NOTE — Progress Notes (Signed)
[facility]  PROGRESS NOTE  BRITNAY MAGNUSSEN ZOX:[id number] DOB: 08-Jun-1941 DOA: 03/11/2016 PCP: [name], MD    Brief HPI:   Active Problems:   Osteoporosis   Hypertension   Hyperlipidemia   Dizziness, nonspecific   Hypokalemia   Dizziness   Assessment/Plan: 1. Dizziness- patient had one episode of dizziness yesterday, which is now resolved. Orthostatics negative, MRI brain shows relatively empty sella and mild agent last subcortical T2 changes over the frontal convexities bilaterally likely reflect sequelae of chronic microvascular ischemia. I called and discussed with neurology Dr. Rosalia Hammers, the findings of MRI brain. He will review MRI brain images and make recommendations if necessary. 2. Hypertension- patient was taking amlodipine, Accupril, HCTZ at home. We will restart his medications and recheck orthostatics. Echocardiogram is currently pending. 3. Hyperlipidemia- continue statins 4. Hypokalemia- up his potassium and check BMP in a.m. 5. Hyperglycemia- patient had hyperglycemia yesterday, hemoglobin A1c has been ordered and currently the results of pending.   DVT prophylaxis: Lovenox Code Status: full code Family Communication: no family at bedside  Disposition Plan: pending work up of dizziness   Consultants:  none  Procedures:  none  Antibiotics:  none   Subjective: Patient seen and examined, admitted with dizziness yesterday. CT head was negative. MRI obtained this morning also negative. At this time dizziness has resolved. She denies nausea and vomiting. Echocardiogram is pending.  Objective: Filed Vitals:   03/12/16 0812 03/12/16 0814 03/12/16 0815 03/12/16 0818  BP: 136/68 136/68 122/94 137/88  Pulse:      Temp:      TempSrc:      Resp:      Height:      Weight:      SpO2:        Intake/Output Summary (Last 24 hours) at 03/12/16 1205 Last data filed at 03/12/16 1100  Gross per 24 hour  Intake 1541.25 ml  Output   1179 ml   Net 362.25 ml   Filed Weights   03/11/16 1241 03/12/16 0429  Weight: 85.1 kg (187 lb 9.8 oz) 88.1 kg (194 lb 3.6 oz)    Examination:  General exam: Appears calm and comfortable  Respiratory system: Clear to auscultation. Respiratory effort normal. Cardiovascular system: S1 & S2 heard, RRR. No JVD, murmurs, rubs, gallops or clicks. No pedal edema. Gastrointestinal system: Abdomen is nondistended, soft and nontender. No organomegaly or masses felt. Normal bowel sounds heard. Central nervous system: Alert and oriented. No focal neurological deficits. Francee Piccolo Daroff maneuver was done which was negative for BPPV Extremities: Symmetric 5 x 5 power. Skin: No rashes, lesions or ulcers Psychiatry: Judgement and insight appear normal. Mood & affect appropriate.    Data Reviewed: I have personally reviewed following labs and imaging studies Basic Metabolic Panel:  Recent Labs Lab 03/11/16 0431 03/11/16 1055 03/12/16 0400  NA 143 140 141  K 2.9* 3.5 3.3*  CL 104 107 107  CO2 26 26 24   GLUCOSE 175* 115* 93  BUN 12 9 5*  CREATININE 0.99 0.83 0.82  CALCIUM 9.6 9.2 8.7*  MG  --  1.9  --    Liver Function Tests:  Recent Labs Lab 03/11/16 0431 03/12/16 0400  AST 22 19  ALT 10* 9*  ALKPHOS 52 50  BILITOT 0.6 0.7  PROT 7.1 6.5  ALBUMIN 3.8 3.2*    Recent Labs Lab 03/11/16 0431  LIPASE 25   No results for input(s): AMMONIA in the last 168 hours. CBC:  Recent Labs Lab 03/11/16 0431 03/12/16  0400  WBC 7.4 6.3  NEUTROABS 4.9  --   HGB 13.1 13.2  HCT 39.5 39.0  MCV 92.7 92.4  PLT 189 167   Cardiac Enzymes:  Recent Labs Lab 03/11/16 0431 03/11/16 1055 03/11/16 1654 03/11/16 2242  TROPONINI <0.03 <0.03 <0.03 <0.03   BNP (last 3 results) No results for input(s): BNP in the last 8760 hours.  ProBNP (last 3 results) No results for input(s): PROBNP in the last 8760 hours.  CBG: No results for input(s): GLUCAP in the last 168 hours.  No results found for  this or any previous visit (from the past 240 hour(s)).   Studies: Dg Chest 1 View  03/11/2016  CLINICAL DATA:  Patient complaining of nausea, weakness and diaphoresis for 1 day. History of hypertension. EXAM: CHEST 1 VIEW COMPARISON:  None. FINDINGS: Cardiac silhouette is top-normal in size. No mediastinal or hilar masses or evidence of adenopathy. Prominent bronchovascular markings. No evidence pulmonary edema or pneumonia. No pleural effusion or pneumothorax. Bony thorax is demineralized but grossly intact. IMPRESSION: No acute cardiopulmonary disease. Electronically Signed   By: Amie Portlandavid  Ormond M.D.   On: 03/11/2016 07:12   Ct Head Wo Contrast  03/11/2016  CLINICAL DATA:  Headache all over.  Nausea and vomiting. EXAM: CT HEAD WITHOUT CONTRAST TECHNIQUE: Contiguous axial images were obtained from the base of the skull through the vertex without intravenous contrast. COMPARISON:  None. FINDINGS: The ventricles are normal in size and configuration. There are no parenchymal masses or mass effect, no evidence of an infarct, no extra-axial masses or abnormal fluid collections no intracranial hemorrhage. Mild left maxillary sinus mucosal thickening. Remaining sinuses are clear as are the mastoid air cells. No skull lesion. IMPRESSION: 1. No intracranial abnormality. Electronically Signed   By: Amie Portlandavid  Ormond M.D.   On: 03/11/2016 07:15   Mr Brain Wo Contrast  03/12/2016  CLINICAL DATA:  New onset dizziness and vomiting beginning at 4 a.m. yesterday. EXAM: MRI HEAD WITHOUT CONTRAST TECHNIQUE: Multiplanar, multiecho pulse sequences of the brain and surrounding structures were obtained without intravenous contrast. COMPARISON:  CT head without contrast 03/11/2016 FINDINGS: The diffusion-weighted images demonstrate no evidence for acute or subacute infarction. Mild scattered punctate T2 hyperintensities over the frontal convexities bilaterally are advanced for age. Dilated perivascular spaces are evident within the  basal ganglia. The ventricles are of normal size. No significant extra-axial fluid collection is present. The internal auditory canals are within normal limits. The brainstem and cerebellum are normal. Flow is present in the major intracranial arteries. Globes and orbits are intact. The paranasal sinuses and the mastoid air cells are clear. Skullbase is within normal limits. Midline sagittal images demonstrate a relatively empty sella. No other focal lesions are present. IMPRESSION: 1. No acute or focal lesion to explain the patient's symptoms. 2. Mild age advanced subcortical T2 changes over the frontal convexities bilaterally likely reflect the sequela of chronic microvascular ischemia. 3. Relatively empty sella is unlikely be of consequence for this patient. The sella is not expanded. No other signs of intracranial hypertension are present. Electronically Signed   By: Marin Robertshristopher  Mattern M.D.   On: 03/12/2016 09:27    Scheduled Meds: . amLODipine  10 mg Oral Daily  . aspirin EC  81 mg Oral Daily  . atorvastatin  40 mg Oral Daily  . lisinopril  10 mg Oral Daily   And  . hydrochlorothiazide  12.5 mg Oral Daily  . sodium chloride flush  3 mL Intravenous Q12H   Continuous Infusions: .  sodium chloride 75 mL/hr at 03/12/16 0037       Time spent: 20 min    [facility]  [facility] Pager [phone]. If 7PM-7AM, please contact night-coverage at [email or web address], Office  [phone]  password [facility] 03/12/2016, 12:05 PM

## 2016-03-12 NOTE — Evaluation (Signed)
Occupational Therapy Evaluation and Discharge Patient Details Name: Kristina Maldonado MRN: [id number] DOB: November 16, 1940 Today's Date: 03/12/2016    History of Present Illness Kristina Maldonado Alter is a 75 y.o. female with medical history significant for HTN, HLD, osteoporosis, glaucoma, presenting to the emergency department for evaluation of dizziness and nausea and emesis.   Clinical Impression   This 75 yo female admitted with above presents to acute OT at a S level due to decreased balance when up on feet and more so with head turns while up on feet. Per daughter someone can stay with pt short term once home. Do not feel basic ADLs will be an issue (explained to pt she would just need to do them at a slower/safer pace than her normal) until dizziness/balance issues are better. No further OT needs, we will sign off. I have recommended a PT vestibular eval and order has been put in by MD.    Follow Up Recommendations  No OT follow up    Equipment Recommendations  Other (comment) (recommendations/handout given to daughter)       Precautions / Restrictions Precautions Precautions: Fall Precaution Comments: balance deviations with head turns while ambulating      Mobility Bed Mobility Overal bed mobility: Independent                Transfers Overall transfer level: Independent Equipment used: None             General transfer comment: with ambulation pt walking at about 1/4 her normal speed per her report, when head turns were added pt slowed pace of ambulation even slower and her steps became shorter almost festinating at times. Pt able to go up  steps without rail with a "step-to" pattern, but unable to come down them with a 'step-to" pattern without use of rail    Balance Overall balance assessment: Needs assistance Sitting-balance support: Feet supported;No upper extremity supported Sitting balance-Leahy Scale: Good     Standing balance support: No upper extremity  supported;During functional activity Standing balance-Leahy Scale: Poor Standing balance comment: when balance is challenged--head turns                            ADL                                         General ADL Comments: S (espcially when up on feet) for all basic ADLs and I ADLs; recommended a shower seat and hand held shower for safety in tub if once home pt and daughter feel this would be better.               Pertinent Vitals/Pain Pain Assessment: No/denies pain     Hand Dominance Right   Extremity/Trunk Assessment Upper Extremity Assessment Upper Extremity Assessment: Overall WFL for tasks assessed   Lower Extremity Assessment Lower Extremity Assessment: Defer to PT evaluation       Communication Communication Communication: No difficulties   Cognition Arousal/Alertness: Awake/alert Behavior During Therapy: WFL for tasks assessed/performed Overall Cognitive Status: Within Functional Limits for tasks assessed                                Home Living Family/patient expects to be discharged to:: Private residence Living Arrangements: Alone Available Help at  Discharge: Family;Available 24 hours/day (short term) Type of Home: House Home Access: Stairs to enter [employer] of Steps: 4 Entrance Stairs-Rails: None Home Layout: One level     Bathroom Shower/Tub: Tub/shower unit;Curtain Shower/tub characteristics: Engineer, building services: Handicapped height     Home Equipment: None          Prior Functioning/Environment Level of Independence: Independent             OT Diagnosis: Generalized weakness         OT Goals(Current goals can be found in the care plan section) Acute Rehab OT Goals Patient Stated Goal: did not state  OT Frequency:                End of Session Equipment Utilized During Treatment: Gait belt Nurse Communication: Mobility status (need for PT eval for  vestibular)  Activity Tolerance: Patient tolerated treatment well Patient left:  (sitting EOB)   Time: 1610-9604 OT Time Calculation (min): 34 min Charges:  OT General Charges $OT Visit: 1 Procedure OT Evaluation $OT Eval Moderate Complexity: 1 Procedure OT Treatments $Self Care/Home Management : 8-22 mins G-Codes: OT G-codes **NOT FOR INPATIENT CLASS** Functional Assessment Tool Used: Clinical observation Functional Limitation: Self care Self Care Current Status (V4098): At least 1 percent but less than 20 percent impaired, limited or restricted Self Care Goal Status (J1914): At least 1 percent but less than 20 percent impaired, limited or restricted Self Care Discharge Status [phone]): At least 1 percent but less than 20 percent impaired, limited or restricted  Evette Georges [phone] 03/12/2016, 4:00 PM

## 2016-03-12 NOTE — Progress Notes (Signed)
  Echocardiogram 2D Echocardiogram has been performed.  Kristina Maldonado, Kristina Maldonado 03/12/2016, 2:32 PM

## 2016-03-13 DIAGNOSIS — I10 Essential (primary) hypertension: Secondary | ICD-10-CM | POA: Diagnosis not present

## 2016-03-13 DIAGNOSIS — E876 Hypokalemia: Secondary | ICD-10-CM | POA: Diagnosis not present

## 2016-03-13 DIAGNOSIS — R42 Dizziness and giddiness: Secondary | ICD-10-CM | POA: Diagnosis not present

## 2016-03-13 DIAGNOSIS — E785 Hyperlipidemia, unspecified: Secondary | ICD-10-CM | POA: Diagnosis not present

## 2016-03-13 LAB — BASIC METABOLIC PANEL
ANION GAP: 5 (ref 5–15)
BUN: 10 mg/dL (ref 6–20)
CALCIUM: 9.3 mg/dL (ref 8.9–10.3)
CO2: 28 mmol/L (ref 22–32)
Chloride: 109 mmol/L (ref 101–111)
Creatinine, Ser: 0.97 mg/dL (ref 0.44–1.00)
GFR, EST NON AFRICAN AMERICAN: 57 mL/min — AB (ref >60)
Glucose, Bld: 107 mg/dL — ABNORMAL HIGH (ref 65–99)
Potassium: 3.6 mmol/L (ref 3.5–5.1)
SODIUM: 142 mmol/L (ref 135–145)

## 2016-03-13 LAB — HEMOGLOBIN A1C
Hgb A1c MFr Bld: 5.8 % — ABNORMAL HIGH (ref 4.8–5.6)
MEAN PLASMA GLUCOSE: 120 mg/dL

## 2016-03-13 MED ORDER — UNABLE TO FIND: Status: DC

## 2016-03-13 NOTE — Progress Notes (Signed)
1534 wheeled to lobby by NT daughter in attendance

## 2016-03-13 NOTE — Evaluation (Signed)
Physical Therapy Evaluation Patient Details Name: Kristina Maldonado MRN: [id number] DOB: Mar 09, 1941 Today's Date: 03/13/2016   History of Present Illness  Kristina Maldonado is a 75 y.o. female with medical history significant for HTN, HLD, osteoporosis, glaucoma, presenting to the emergency department for evaluation of dizziness and nausea and emesis.  Clinical Impression  Pt admitted with above diagnosis. Patient tests negative for BPPV in all planes, although her presentation strongly suggests BPPV. She has also had "allergies" with sinus drainage past few weeks and does show signs of vestibular hypofunction (see testing below). Patient and daughter agree to OPPT for further testing and pt will have 24/7 supervision assistance. Pt currently with functional limitations due to the deficits listed below (see PT Problem List).  Pt will benefit from skilled OPPT to increase their independence and safety with mobility. No further acute PT needs at this time.       Follow Up Recommendations Outpatient PT;Supervision/Assistance - 24 hour (Vestibular Rehabilitation)    Equipment Recommendations  None recommended by PT    Recommendations for Other Services       Precautions / Restrictions Precautions Precautions: Fall Precaution Comments: balance deviations with head turns while ambulating      Mobility  Bed Mobility Overal bed mobility: Independent                Transfers Overall transfer level: Independent Equipment used: None                Ambulation/Gait Ambulation/Gait assistance: Min guard Ambulation Distance (Feet): 400 Feet Assistive device: None Gait Pattern/deviations: Step-through pattern;Drifts right/left   Gait velocity interpretation: Below normal speed for age/gender General Gait Details: slows her gait, hesitant; able to vary speed up/down much better today (per daughter); continues to drift (pt reports she feels pulled offbalance) with head turns Rt and  Lt  Stairs            Wheelchair Mobility    Modified Rankin (Stroke Patients Only)       Balance    03/13/16 0001  Vestibular Assessment  General Observation drifts left and right with head turns and walking  Symptom Behavior  Type of Dizziness Imbalance  Duration of Dizziness seconds  Aggravating Factors Turning head quickly;Turning head sideways  Relieving Factors Head stationary;Slow movements  Occulomotor Exam  Occulomotor Alignment Normal  Spontaneous Absent  Gaze-induced Absent  Smooth Pursuits Intact  Vestibulo-Occular Reflex  VOR 1 Head Only (x 1 viewing) eyes slip off target  VOR to Slow Head Movement Normal  Positional Testing  Dix-Hallpike Dix-Hallpike Right;Dix-Hallpike Left  Horizontal Canal Testing Horizontal Canal Right;Horizontal Canal Left  Dix-Hallpike Right  Dix-Hallpike Right Duration 0  Dix-Hallpike Right Symptoms No nystagmus  Dix-Hallpike Left  Dix-Hallpike Left Duration 0  Dix-Hallpike Left Symptoms No nystagmus  Horizontal Canal Right  Horizontal Canal Right Duration 0  Horizontal Canal Right Symptoms Normal  Horizontal Canal Left  Horizontal Canal Left Duration 0  Horizontal Canal Left Symptoms Normal  Cognition  Cognition Orientation Level Appropriate for developmental age  Orthostatics  BP supine (x 5 minutes) (normal, see flowsheet)           Standing balance support: No upper extremity supported Standing balance-Leahy Scale: Fair Standing balance comment: static stance without UE support independently                             Pertinent Vitals/Pain Pain Assessment: No/denies pain    Home Living  Family/patient expects to be discharged to:: Private residence Living Arrangements: Alone Available Help at Discharge: Family;Available 24 hours/day (short term) Type of Home: House Home Access: Stairs to enter Entrance Stairs-Rails: None Entrance Stairs-Number of Steps: 4 Home Layout: One level Home  Equipment: None Additional Comments: undecided if will go to her daughter's house or dtr stay with her    Prior Function Level of Independence: Independent               Hand Dominance   Dominant Hand: Right    Extremity/Trunk Assessment   Upper Extremity Assessment: Overall WFL for tasks assessed           Lower Extremity Assessment: Overall WFL for tasks assessed      Cervical / Trunk Assessment: Normal  Communication   Communication: No difficulties  Cognition Arousal/Alertness: Awake/alert Behavior During Therapy: WFL for tasks assessed/performed Overall Cognitive Status: Within Functional Limits for tasks assessed                      General Comments General comments (skin integrity, edema, etc.): Daughter present throughout. Patient reports vertigo/spinning with initial onset. Has not had vertigo since arriving at hospital. Had 2 doses of meclizine 5/20. Daughter will be sure pt has 24/7 assist. Instructed patient not to drive until cleared by OPPT or MD    Exercises Other Exercises Other Exercises: x1 exercise with horizontal head turns x 60 seconds with eyes occasionally losing target (however pt denies symptoms); vertically x 45 seconds with no difficulty      Assessment/Plan    PT Assessment All further PT needs can be met in the next venue of care  PT Diagnosis Difficulty walking   PT Problem List Decreased activity tolerance;Decreased balance;Decreased mobility  PT Treatment Interventions     PT Goals (Current goals can be found in the Care Plan section) Acute Rehab PT Goals Patient Stated Goal: wants to be independent PT Goal Formulation: All assessment and education complete, DC therapy    Frequency     Barriers to discharge        Co-evaluation               End of Session Equipment Utilized During Treatment: Gait belt Activity Tolerance: Patient tolerated treatment well Patient left: in chair;with call bell/phone  within reach;with family/visitor present Nurse Communication: Mobility status;Other (comment) (needs OPPT )    Functional Assessment Tool Used: clinical judgement Functional Limitation: Mobility: Walking and moving around Mobility: Walking and Moving Around Current Status [phone]): At least 1 percent but less than 20 percent impaired, limited or restricted Mobility: Walking and Moving Around Goal Status [phone]): At least 1 percent but less than 20 percent impaired, limited or restricted Mobility: Walking and Moving Around Discharge Status [phone]): At least 1 percent but less than 20 percent impaired, limited or restricted    Time: 0955-1050 PT Time Calculation (min) (ACUTE ONLY): 55 min   Charges:   PT Evaluation $PT Eval Moderate Complexity: 1 Procedure PT Treatments $Gait Training: 8-22 mins $Self Care/Home Management: 8-22   PT G Codes:   PT G-Codes **NOT FOR INPATIENT CLASS** Functional Assessment Tool Used: clinical judgement Functional Limitation: Mobility: Walking and moving around Mobility: Walking and Moving Around Current Status (M4268): At least 1 percent but less than 20 percent impaired, limited or restricted Mobility: Walking and Moving Around Goal Status [phone]): At least 1 percent but less than 20 percent impaired, limited or restricted Mobility: Walking and Moving Around Discharge  Status [phone]): At least 1 percent but less than 20 percent impaired, limited or restricted    Naina Sleeper 03/13/2016, 11:16 AM Pager [phone]

## 2016-03-13 NOTE — Discharge Summary (Signed)
Physician Discharge Summary  Kristina Maldonado [id number] DOB: October 18, 1941 DOA: 03/11/2016  PCP: Carollee HerterLALONDE,[name], MD  Admit date: 03/11/2016 Discharge date: 03/13/2016  Time spent: 35* minutes  Recommendations for Outpatient Follow-up:  1. Follow up PCP in 2 weeks 2. Outpatient vestibular PT    Discharge Diagnoses:  Active Problems:   Osteoporosis   Hypertension   Hyperlipidemia   Dizziness, nonspecific   Hypokalemia   Dizziness   Discharge Condition: Stable  Diet recommendation: Heart healthy diet  Filed Weights   03/11/16 1241 03/12/16 0429  Weight: 85.1 kg (187 lb 9.8 oz) 88.1 kg (194 lb 3.6 oz)    History of present illness:  75 y.o. female with medical history significant for HTN, HLD, osteoporosis, glaucoma, presenting to the emergency department for evaluation of dizziness and nausea.She reports these symptoms waking her up around 4 am as she was to go to the bathroom. At that time, she felt very dizzy, with a decrease in balance. She denies any vision changes. She denies any headaches. No dysarthria or dysphagia. Denies any unilateral weakness or sensory deficiencies. Denies any confusion. Denies any chest pain, or shortness of breath. Denies any fever or chills, or night sweats. Denies any abdominal pain, or diarrhea. She denies any sick contacts or new foods.Denies any recent long distance trips. No recent surgeries. Denies lower extremity swelling. Denies abnormal skin rashes, or neuropathy. She is compliant with her medications. She was last seen Normal around 10 PM.   ED Course: she received IV Zofran, meclizine with some improvement in her symptoms, but she continues to be unable to ambulate without becoming dizzy. BP 116/76 mmHg  Pulse 76  Resp 16  SpO2 100% blood sugar is 175. CT of the head is negative for acute intracranial abnormalities. No TPA was received. Tn negative. CBC is normal. CMET remarkable for K 2.9. EKG Sinus arrhythmia Borderline left  axis deviation Borderline abnrm T, anterolateral leads Prolonged QT interval 606 (no prior EkG available)                    Hospital Course:  1. Dizziness- patient had one episode of dizziness prior to admission, which is now resolved. Orthostatics negative, MRI brain shows relatively empty sella and mild agent last subcortical T2 changes over the frontal convexities bilaterally likely reflect sequelae of chronic microvascular ischemia. I called and discussed with neurology Dr. Rosalia Maldonado, the findings of MRI brain, no acute changes. No intervention needed.Vestibuar PT evaluated the patient and recommended outpatient vestibular PT 2. Hypertension- patient was taking amlodipine, Accupril, HCTZ at home. Orthostatic vital signs negative in the hospital, will continue to take the mediations. 3. Hyperlipidemia- continue statins 4. Hypokalemia- resolved, potassium is 3.6 today. 5. Hyperglycemia-  Resolved, patient had one episode of  hyperglycemia  In the hospital, hemoglobin A1c is 5.8   Procedures:  None   Consultations:  None   Discharge Exam: Filed Vitals:   03/13/16 0653 03/13/16 0755  BP: 115/81 109/75  Pulse: 68 63  Temp: 98 F (36.7 C) 98.2 F (36.8 C)  Resp: 18 18    General: Appears in no acute distress Cardiovascular: S1S2 RRR Respiratory: clear bilaterally   Discharge Instructions   Discharge Instructions    Diet - low sodium heart healthy    Complete by:  As directed      Increase activity slowly    Complete by:  As directed           Current Discharge Medication List  START taking these medications   Details  UNABLE TO FIND Outpatient  Vestibular PT 3 x week Qty: 1 each, Refills: 0      CONTINUE these medications which have NOT CHANGED   Details  alendronate (FOSAMAX) 70 MG tablet TAKE 1 TABLET EVERY 7 DAYS WITH A FULL GLASS OF WATER ON AN EMPTY STOMACH Qty: 12 tablet, Refills: 1    amLODipine (NORVASC) 10 MG tablet Take 1 tablet (10 mg total) by  mouth daily. Qty: 90 tablet, Refills: 1    aspirin 81 MG tablet Take 81 mg by mouth daily.      atorvastatin (LIPITOR) 40 MG tablet Take 1 tablet (40 mg total) by mouth daily. Qty: 90 tablet, Refills: 1    Multiple Vitamins-Minerals (MULTIVITAMIN WITH MINERALS) tablet Take 1 tablet by mouth daily.      quinapril-hydrochlorothiazide (ACCURETIC) 10-12.5 MG tablet Take 1 tablet by mouth daily. Qty: 90 tablet, Refills: 1       No Known Allergies    The results of significant diagnostics from this hospitalization (including imaging, microbiology, ancillary and laboratory) are listed below for reference.    Significant Diagnostic Studies: Dg Chest 1 View  03/11/2016  CLINICAL DATA:  Patient complaining of nausea, weakness and diaphoresis for 1 day. History of hypertension. EXAM: CHEST 1 VIEW COMPARISON:  None. FINDINGS: Cardiac silhouette is top-normal in size. No mediastinal or hilar masses or evidence of adenopathy. Prominent bronchovascular markings. No evidence pulmonary edema or pneumonia. No pleural effusion or pneumothorax. Bony thorax is demineralized but grossly intact. IMPRESSION: No acute cardiopulmonary disease. Electronically Signed   By: Amie Portland M.D.   On: 03/11/2016 07:12   Ct Head Wo Contrast  03/11/2016  CLINICAL DATA:  Headache all over.  Nausea and vomiting. EXAM: CT HEAD WITHOUT CONTRAST TECHNIQUE: Contiguous axial images were obtained from the base of the skull through the vertex without intravenous contrast. COMPARISON:  None. FINDINGS: The ventricles are normal in size and configuration. There are no parenchymal masses or mass effect, no evidence of an infarct, no extra-axial masses or abnormal fluid collections no intracranial hemorrhage. Mild left maxillary sinus mucosal thickening. Remaining sinuses are clear as are the mastoid air cells. No skull lesion. IMPRESSION: 1. No intracranial abnormality. Electronically Signed   By: Amie Portland M.D.   On: 03/11/2016  07:15   Mr Brain Wo Contrast  03/12/2016  CLINICAL DATA:  New onset dizziness and vomiting beginning at 4 a.m. yesterday. EXAM: MRI HEAD WITHOUT CONTRAST TECHNIQUE: Multiplanar, multiecho pulse sequences of the brain and surrounding structures were obtained without intravenous contrast. COMPARISON:  CT head without contrast 03/11/2016 FINDINGS: The diffusion-weighted images demonstrate no evidence for acute or subacute infarction. Mild scattered punctate T2 hyperintensities over the frontal convexities bilaterally are advanced for age. Dilated perivascular spaces are evident within the basal ganglia. The ventricles are of normal size. No significant extra-axial fluid collection is present. The internal auditory canals are within normal limits. The brainstem and cerebellum are normal. Flow is present in the major intracranial arteries. Globes and orbits are intact. The paranasal sinuses and the mastoid air cells are clear. Skullbase is within normal limits. Midline sagittal images demonstrate a relatively empty sella. No other focal lesions are present. IMPRESSION: 1. No acute or focal lesion to explain the patient's symptoms. 2. Mild age advanced subcortical T2 changes over the frontal convexities bilaterally likely reflect the sequela of chronic microvascular ischemia. 3. Relatively empty sella is unlikely be of consequence for this patient. The sella  is not expanded. No other signs of intracranial hypertension are present. Electronically Signed   By: Marin Roberts M.D.   On: 03/12/2016 09:27    Microbiology: No results found for this or any previous visit (from the past 240 hour(s)).   Labs: Basic Metabolic Panel:  Recent Labs Lab 03/11/16 0431 03/11/16 1055 03/12/16 0400 03/13/16 0312  NA 143 140 141 142  K 2.9* 3.5 3.3* 3.6  CL 104 107 107 109  CO2 GLUCOSE 175* 115* 93 107*  BUN 12 9 5* 10  CREATININE 0.99 0.83 0.82 0.97  CALCIUM 9.6 9.2 8.7* 9.3  MG  --  1.9  --    --    Liver Function Tests:  Recent Labs Lab 03/11/16 0431 03/12/16 0400  AST 22 19  ALT 10* 9*  ALKPHOS 52 50  BILITOT 0.6 0.7  PROT 7.1 6.5  ALBUMIN 3.8 3.2*    Recent Labs Lab 03/11/16 0431  LIPASE 25   No results for input(s): AMMONIA in the last 168 hours. CBC:  Recent Labs Lab 03/11/16 0431 03/12/16 0400  WBC 7.4 6.3  NEUTROABS 4.9  --   HGB 13.1 13.2  HCT 39.5 39.0  MCV 92.7 92.4  PLT 189 167   Cardiac Enzymes:  Recent Labs Lab 03/11/16 0431 03/11/16 1055 03/11/16 1654 03/11/16 2242  TROPONINI <0.03 <0.03 <0.03 <0.03     Signed:  Meredeth Ide MD.  [facility] 03/13/2016, 11:10 AM

## 2016-03-13 NOTE — Progress Notes (Signed)
CM talked to patient about outpatient physical therapy/ vestibular training; Choice offered, patient requested the outpatient rehab facility on [address] stre[address] - [facility]; Referral made as requested; They will contact the patient at home for a start up date and time for her therapy; Alexis GoodellB Anndee Connett RN,MHA,BSN [phone]

## 2016-03-14 ENCOUNTER — Ambulatory Visit: Payer: Medicare Other | Attending: Family Medicine | Admitting: Physical Therapy

## 2016-03-14 ENCOUNTER — Telehealth: Payer: Self-pay | Admitting: Family Medicine

## 2016-03-14 DIAGNOSIS — R42 Dizziness and giddiness: Secondary | ICD-10-CM | POA: Diagnosis not present

## 2016-03-14 NOTE — Telephone Encounter (Signed)
Call pt to discuss recent hospital stay. Pt states that she is "adjusting" and feeling better. Medication were discussed and they all stated the same and she has all filled at home and does not needs any assistance with that. Pt was reminded of hospital follow up appt on 05/31 and she confirmed that would be ok. Pt was asked to bring all her medications with her and she stated she would. Pt was told if she needed anything between now and her appt during office hours to call and after hours protocol was gone over. Pt verbalized understanding.

## 2016-03-14 NOTE — Therapy (Addendum)
[facility] [address] [place], [place], [address] Phone: [phone]   Fax:  [phone]  Physical Therapy Evaluation  Patient Details  Name: Kristina Maldonado MRN: [id number] Date of Birth: 05/24/1941 Referring Provider: Sharlot GowdaJohn [name], MD  Encounter Date: 03/14/2016      PT End of Session - 03/14/16 1540    Visit Number 1   Authorization Type U[employer] Medicare   PT Start Time 1500   PT Stop Time 1535   PT Time Calculation (min) 35 min   Activity Tolerance Patient tolerated treatment well   Behavior [facility] for tasks assessed/performed      Past Medical History  Diagnosis Date  . Hypertension   . Obesity   . Dyslipidemia   . Osteopenia   . Allergy   . Hyperlipidemia     Past Surgical History  Procedure Laterality Date  . Colonoscopy  2009    PERRY  . Abdominal hysterectomy      There were no vitals filed for this visit.       Subjective Assessment - 03/14/16 1503    Subjective Pt is a 75 y/o female who presents to OPPT s/p episodes of vertigo beginning Saturday 03/11/16.  Pt hospitalized 5/20-5/22.  Pt reports dizziness is resolved but c/o feeling "off balance."   Patient is accompained by: Family member   Pertinent History HTN, HLD, osteopenia   Patient Stated Goals walk without devices, improve balance   Currently in Pain? No/denies            [facility] Assessment - 03/14/16 1506    Assessment   Medical Diagnosis vertigo   Referring Provider Sharlot GowdaJohn [name], MD   Onset Date/Surgical Date 03/11/16   Next MD Visit 03/22/16   Prior Therapy in hospital   Precautions   Precautions None   Restrictions   Weight Bearing Restrictions No   Balance Screen   Has the patient fallen in the past 6 months No   Has the patient had a decrease in activity level because of a fear of falling?  Yes   Is the patient reluctant to leave their home because of a fear of falling?  No   Home Environment   Living Environment Private residence   Living Arrangements Alone   Type of Home House   Home Access Stairs to enter   Entrance Stairs-Number of Steps 3   Entrance Stairs-Rails None   Home Layout One level   Prior Function   Level of Independence Independent   Vocation Retired   Designer, multimediaLeisure yardwork, walking   Observation/Other Assessments   Focus on Therapeutic Outcomes (FOTO)  60 (40% limited)   Dizziness Handicap Inventory (DHI)  8   Ambulation/Gait   Ambulation/Gait Yes   Ambulation/Gait Assistance 7: Independent   Ambulation Distance (Feet) 100 Feet   Assistive device None   Gait Pattern Within Functional Limits   Ambulation Surface Level;Indoor   Gait velocity 4.67 fts/ec  7.03 sec   High Level Balance   High Level Balance Comments able to stand 15-20 sec with feet apart on compliant surface EO and EC without LOB; LOB with feet together and EC on compliant surface            Vestibular Assessment - 03/14/16 1509    Symptom Behavior   Type of Dizziness Imbalance  initially reports spinning   Aggravating Factors Turning body quickly   Relieving Factors Head stationary;Slow movements   Occulomotor Exam   Occulomotor  Alignment Normal   Spontaneous Absent   Gaze-induced Absent   Smooth Pursuits Intact   Saccades Poor trajectory  mild undershooting without symptoms   Comment head thrust test WNL   Vestibulo-Occular Reflex   VOR 1 Head Only (x 1 viewing) WNL   Positional Testing   Dix-Hallpike Dix-Hallpike Right;Dix-Hallpike Left   Horizontal Canal Testing Horizontal Canal Right;Horizontal Canal Left   Dix-Hallpike Right   Dix-Hallpike Right Duration none   Dix-Hallpike Right Symptoms No nystagmus   Dix-Hallpike Left   Dix-Hallpike Left Duration none   Dix-Hallpike Left Symptoms No nystagmus   Horizontal Canal Right   Horizontal Canal Right Duration none   Horizontal Canal Right Symptoms Normal   Horizontal Canal Left   Horizontal Canal Left Duration none    Horizontal Canal Left Symptoms Normal                       PT Education - 04-Apr-2016 1539    Education provided Yes   Education Details clinical findings; POC   Person(s) Educated Patient;Child(ren)   Methods Explanation   Comprehension Verbalized understanding             PT Long Term Goals - 2016-04-04 1544    PT LONG TERM GOAL #1   Title to be determined if pt returns               Plan - 2016/04/04 1540    Clinical Impression Statement Pt is a 76 y/o female who presents to OPPT for a low complexity evaluation of vertigo. Pt reports vertigo resolved and only feels occasional episodes described as "imbalance."  Daughter present and reports she feels pt is very closely back to baseline (able to shower independently, go shopping without symptoms and negotiated compliant surfaces without difficulty).  At this time skilled PT is not indicated and recommend continued activity and gradual return to exercise.  Chart to remain open and if symptoms return or pt reports continued sense of "imbalance" to call and return to PT.     Rehab Potential Good   PT Frequency --  to be determined if pt returns   PT Treatment/Interventions ADLs/Self Care Home Management;Canalith Repostioning;Patient/family education;Neuromuscular re-education;Balance training;Therapeutic exercise;Therapeutic activities;Functional mobility training;Stair training;Gait training;Vestibular   PT Next Visit Plan if pt returns; reassess, SOT; will need new g code   Consulted and Agree with Plan of Care Patient;Family member/caregiver   Family Member Consulted daughter      Patient will benefit from skilled therapeutic intervention in order to improve the following deficits and impairments:  Dizziness, Decreased balance (per pt perception; clinical testing negative)  Visit Diagnosis: Dizziness and giddiness - Plan: PT plan of care cert/re-cert      G-Codes - 04/04/2016 1546    Functional Assessment  Tool Used DHI 8   Functional Limitation Mobility: Walking and moving around   Mobility: Walking and Moving Around Current Status (Z6109) At least 1 percent but less than 20 percent impaired, limited or restricted   Mobility: Walking and Moving Around Goal Status [phone]) At least 1 percent but less than 20 percent impaired, limited or restricted   Mobility: Walking and Moving Around Discharge Status [phone]) At least 1 percent but less than 20 percent impaired, limited or restricted       Problem List Patient Active Problem List   Diagnosis Date Noted  . Dizziness, nonspecific 03/11/2016  . Hypokalemia 03/11/2016  . Dizziness 03/11/2016  . Allergic rhinitis due to pollen 09/25/2011  .  Encounter for long-term (current) use of medications 04/06/2011  . Osteoporosis 04/06/2011  . Hypertension 04/06/2011  . Hyperlipidemia 04/06/2011   Clarita Crane, PT, DPT 03/14/2016 3:50 PM  [facility] [address] [place], [place], [address] Phone: [phone]   Fax:  [phone]  Name: Kristina Maldonado MRN: [id number] Date of Birth: 1940-12-22       Addendum 04/14/16: Pt did not call to return to PT; anticipate pt doing well without PT needs.  Will sign off.  Clarita Crane, PT, DPT 04/14/2016 8:00 AM  [facility] [address] [place], [place] [address]  [phone] (office) [phone] (fax)

## 2016-03-22 ENCOUNTER — Encounter: Payer: Self-pay | Admitting: Family Medicine

## 2016-03-22 ENCOUNTER — Ambulatory Visit (INDEPENDENT_AMBULATORY_CARE_PROVIDER_SITE_OTHER): Payer: Medicare Other | Admitting: Family Medicine

## 2016-03-22 VITALS — BP 110/80 | HR 66 | Ht 66.0 in | Wt 201.6 lb

## 2016-03-22 DIAGNOSIS — I10 Essential (primary) hypertension: Secondary | ICD-10-CM

## 2016-03-22 DIAGNOSIS — M810 Age-related osteoporosis without current pathological fracture: Secondary | ICD-10-CM | POA: Diagnosis not present

## 2016-03-22 DIAGNOSIS — R42 Dizziness and giddiness: Secondary | ICD-10-CM

## 2016-03-22 DIAGNOSIS — E785 Hyperlipidemia, unspecified: Secondary | ICD-10-CM | POA: Diagnosis not present

## 2016-03-22 NOTE — Progress Notes (Signed)
   Subjective:    Patient ID: Kristina Maldonado, female    DOB: 01-Aug-1941, 75 y.o.   MRN: [id number]  HPI She is here for posthospitalization follow-up visit. She was admitted on the 20th and sent home on the 22nd. She was evaluated for dizziness and had an extensive workup including cardiac, CNS and blood work. The emergency room record, discharge summary was reviewed extensively. Presently she continues on her amlodipine for her blood pressure as well as atorvastatin and Fosamax and quinapril. She is having no difficulty with these medications. Presently she is having no blurred vision, double vision, dizziness, weakness, numbness or tingling.   Review of Systems     Objective:   Physical Exam Alert and in no distress. Tympanic membranes and canals are normal. Pharyngeal area is normal. Neck is supple without adenopathy or thyromegaly. Cardiac exam shows a regular sinus rhythm without murmurs or gallops. Lungs are clear to auscultation. Cerebellar testing normal.         Assessment & Plan:  Essential hypertension  Dizzinesses  Hyperlipidemia  Osteoporosis She is doing well on her present regimen. Since she is not having any further symptoms, referral to physical therapy for dizziness is not warranted. She was comfortable with this. Her symptoms really do not point towards positional vertigo. Greater than 20 minutes, over 50% spent in counseling and coordination of care. I reviewed her medical record in the hospital with her extensively. She is comfortable with no further intervention.

## 2016-04-24 ENCOUNTER — Other Ambulatory Visit: Payer: Self-pay | Admitting: Family Medicine

## 2016-06-09 DIAGNOSIS — M8589 Other specified disorders of bone density and structure, multiple sites: Secondary | ICD-10-CM | POA: Diagnosis not present

## 2016-06-09 LAB — HM DEXA SCAN

## 2016-06-19 ENCOUNTER — Encounter: Payer: Self-pay | Admitting: Family Medicine

## 2016-09-04 ENCOUNTER — Encounter: Payer: Self-pay | Admitting: Family Medicine

## 2016-09-04 DIAGNOSIS — H04123 Dry eye syndrome of bilateral lacrimal glands: Secondary | ICD-10-CM | POA: Diagnosis not present

## 2016-09-04 DIAGNOSIS — H2513 Age-related nuclear cataract, bilateral: Secondary | ICD-10-CM | POA: Diagnosis not present

## 2016-09-04 DIAGNOSIS — H40023 Open angle with borderline findings, high risk, bilateral: Secondary | ICD-10-CM | POA: Diagnosis not present

## 2016-09-04 DIAGNOSIS — H25013 Cortical age-related cataract, bilateral: Secondary | ICD-10-CM | POA: Diagnosis not present

## 2016-09-04 LAB — HM DIABETES EYE EXAM

## 2016-09-05 ENCOUNTER — Encounter: Payer: Self-pay | Admitting: Family Medicine

## 2016-09-08 ENCOUNTER — Other Ambulatory Visit: Payer: Self-pay | Admitting: Family Medicine

## 2016-09-20 ENCOUNTER — Encounter: Payer: Self-pay | Admitting: Family Medicine

## 2016-10-31 ENCOUNTER — Ambulatory Visit (INDEPENDENT_AMBULATORY_CARE_PROVIDER_SITE_OTHER): Payer: Medicare Other | Admitting: Family Medicine

## 2016-10-31 ENCOUNTER — Encounter: Payer: Self-pay | Admitting: Family Medicine

## 2016-10-31 VITALS — BP 122/80 | HR 75 | Ht 65.0 in | Wt 199.4 lb

## 2016-10-31 DIAGNOSIS — E785 Hyperlipidemia, unspecified: Secondary | ICD-10-CM | POA: Diagnosis not present

## 2016-10-31 DIAGNOSIS — M810 Age-related osteoporosis without current pathological fracture: Secondary | ICD-10-CM

## 2016-10-31 DIAGNOSIS — H269 Unspecified cataract: Secondary | ICD-10-CM

## 2016-10-31 DIAGNOSIS — J301 Allergic rhinitis due to pollen: Secondary | ICD-10-CM | POA: Diagnosis not present

## 2016-10-31 DIAGNOSIS — I10 Essential (primary) hypertension: Secondary | ICD-10-CM | POA: Diagnosis not present

## 2016-10-31 DIAGNOSIS — Z0000 Encounter for general adult medical examination without abnormal findings: Secondary | ICD-10-CM

## 2016-10-31 LAB — COMPREHENSIVE METABOLIC PANEL
ALT: 7 U/L (ref 6–29)
AST: 20 U/L (ref 10–35)
Albumin: 3.8 g/dL (ref 3.6–5.1)
Alkaline Phosphatase: 52 U/L (ref 33–130)
BILIRUBIN TOTAL: 0.6 mg/dL (ref 0.2–1.2)
BUN: 12 mg/dL (ref 7–25)
CHLORIDE: 104 mmol/L (ref 98–110)
CO2: 29 mmol/L (ref 20–31)
CREATININE: 0.99 mg/dL — AB (ref 0.60–0.93)
Calcium: 9.7 mg/dL (ref 8.6–10.4)
Glucose, Bld: 94 mg/dL (ref 65–99)
Potassium: 4.3 mmol/L (ref 3.5–5.3)
SODIUM: 140 mmol/L (ref 135–146)
Total Protein: 6.9 g/dL (ref 6.1–8.1)

## 2016-10-31 LAB — POCT URINALYSIS DIPSTICK
Bilirubin, UA: NEGATIVE
Blood, UA: NEGATIVE
Glucose, UA: NEGATIVE
KETONES UA: NEGATIVE
Nitrite, UA: NEGATIVE
PH UA: 6.5
PROTEIN UA: NEGATIVE
SPEC GRAV UA: 1.015
UROBILINOGEN UA: NEGATIVE

## 2016-10-31 LAB — LIPID PANEL
Cholesterol: 237 mg/dL — ABNORMAL HIGH (ref <200)
HDL: 105 mg/dL (ref >50)
LDL Cholesterol: 120 mg/dL — ABNORMAL HIGH (ref <100)
Total CHOL/HDL Ratio: 2.3 Ratio (ref <5.0)
Triglycerides: 62 mg/dL (ref <150)
VLDL: 12 mg/dL (ref <30)

## 2016-10-31 LAB — CBC WITH DIFFERENTIAL/PLATELET
BASOS ABS: 0 {cells}/uL (ref 0–200)
Basophils Relative: 0 %
EOS PCT: 1 %
Eosinophils Absolute: 46 cells/uL (ref 15–500)
HCT: 42.2 % (ref 35.0–45.0)
Hemoglobin: 13.9 g/dL (ref 11.7–15.5)
LYMPHS ABS: 1886 {cells}/uL (ref 850–3900)
Lymphocytes Relative: 41 %
MCH: 31.7 pg (ref 27.0–33.0)
MCHC: 32.9 g/dL (ref 32.0–36.0)
MCV: 96.3 fL (ref 80.0–100.0)
MONOS PCT: 7 %
MPV: 11 fL (ref 7.5–12.5)
Monocytes Absolute: 322 cells/uL (ref 200–950)
NEUTROS ABS: 2346 {cells}/uL (ref 1500–7800)
NEUTROS PCT: 51 %
PLATELETS: 207 10*3/uL (ref 140–400)
RBC: 4.38 MIL/uL (ref 3.80–5.10)
RDW: 14.1 % (ref 11.0–15.0)
WBC: 4.6 10*3/uL (ref 4.0–10.5)

## 2016-10-31 MED ORDER — ATORVASTATIN CALCIUM 40 MG PO TABS: 40.0000 mg | ORAL_TABLET | Freq: Every day | ORAL | 3 refills | Status: DC

## 2016-10-31 MED ORDER — AMLODIPINE BESYLATE 10 MG PO TABS: 10.0000 mg | ORAL_TABLET | Freq: Every day | ORAL | 3 refills | Status: DC

## 2016-10-31 MED ORDER — QUINAPRIL-HYDROCHLOROTHIAZIDE 10-12.5 MG PO TABS: 1.0000 | ORAL_TABLET | Freq: Every day | ORAL | 3 refills | Status: DC

## 2016-10-31 NOTE — Progress Notes (Signed)
Kristina Maldonado is a 76 y.o. female who presents for annual wellness visit complete examination and follow-up on chronic medical conditions. She continues on amlodipine and quinapril/HCTZ for her blood pressure and having no difficulty with this. She also takes atorvastatin and this had no aches or pains or muscle issues. She also takes a multivitamin with extra vitamin D. She does have a previous history of osteoporosis however has been on Fosamax for slightly over 5 years. She does have underlying allergies but is having no difficulty with them. She is retired and enjoying her retirement. She does spend time with her grandchildren. She does see an optometrist and does have cataracts but they're apparently not ready for any surgical intervention.  Immunizations and Health Maintenance Immunization History  Administered Date(s) Administered  . Influenza Split 08/22/2010, 09/11/2011, 09/03/2012  . Influenza, High Dose Seasonal PF 07/08/2013, 07/14/2014, 09/21/2015  . Influenza-Unspecified 08/01/2016  . Pneumococcal Conjugate-13 07/14/2014  . Pneumococcal Polysaccharide-23 07/07/2009  . Td 08/02/1999  . Tdap 09/19/2010  . Zoster 09/24/2006   There are no preventive care reminders to display for this patient.  Last Pap smear: had hysterectomy  Last mammogram:  01/2016 Last colonoscopy: 09 Last DEXA: 05/2016  Dentist: don't go to dentist   Ophtho: last year Exercise: working in the yard, [place] [facility] [employer] [facility]  Other doctors caring for patient include:Ophthalmologist   Advanced directives:Information given. Encouraged her to bring a copy in for the records.    Depression screen:  See questionnaire below.  Depression screen [facility] 2/9 10/31/2016 10/26/2015 10/21/2014 10/10/2013  Decreased Interest 0 0 0 0  Down, Depressed, Hopeless 0 0 0 0  PHQ - 2 Score 0 0 0 0    Fall Risk Screen: see questionnaire below. Fall Risk  10/31/2016 10/26/2015 10/21/2014 10/10/2013 10/07/2012   Falls in the past year? No No No No No    ADL screen:  See questionnaire below Functional Status Survey: Is the patient deaf or have difficulty hearing?: No Does the patient have difficulty seeing, even when wearing glasses/contacts?: No Does the patient have difficulty concentrating, remembering, or making decisions?: No Does the patient have difficulty walking or climbing stairs?: No Does the patient have difficulty dressing or bathing?: No Does the patient have difficulty doing errands alone such as visiting a doctor's office or shopping?: No   Review of Systems Constitutional: -, -unexpected weight change, -anorexia, -fatigue ENT: -runny nose, -ear pain, -sore throat,  Cardiology:  -chest pain, -palpitations, -orthopnea, Respiratory: -cough, -shortness of breath, -dyspnea on exertion, -wheezing,  Gastroenterology: -abdominal pain, -nausea, -vomiting, -diarrhea, -constipation, -dysphagia Hematology: -bleeding or bruising problems Musculoskeletal: -arthralgias, -myalgias, -joint swelling, -back pain, -   Urology: -dysuria, -difficulty urinating,  -urinary frequency, -urgency, incontinence Neurology: -, -numbness, , -memory loss, -falls, -dizziness    PHYSICAL EXAM:  Ht 5\' 5"  (1.651 m)   Wt 199 lb 6.4 oz (90.4 kg)   BMI 33.18 kg/m   General Appearance: Alert, cooperative, no distress, appears stated age Head: Normocephalic, without obvious abnormality, atraumatic Eyes: PERRL, conjunctiva/corneas clear, EOM's intact, fundi benign Ears: Normal TM's and external ear canals Nose: Nares normal, mucosa normal, no drainage or sinus tenderness Throat: Lips, mucosa, and tongue normal; teeth and gums normal Neck: Supple, no lymphadenopathy;  thyroid:  no enlargement/tenderness/nodules; no carotid bruit or JVD Lungs: Clear to auscultation bilaterally without wheezes, rales or ronchi; respirations unlabored Heart: Regular rate and rhythm, S1 and S2 normal, no murmur, rubor  gallop Abdomen: Soft,  non-tender, nondistended, normoactive bowel sounds,  no masses, no hepatosplenomegaly Extremities: No clubbing, cyanosis or edema Pulses: 2+ and symmetric all extremities Skin:  Skin color, texture, turgor normal, no rashes or lesions Lymph nodes: Cervical, supraclavicular, and axillary nodes normal Neurologic:  CNII-XII intact, normal strength, sensation and gait; reflexes 2+ and symmetric throughout Psych: Normal mood, affect, hygiene and grooming.  ASSESSMENT/PLAN: Routine general medical examination at a health care facility - Plan: Urinalysis Dipstick, CBC with Differential/Platelet, Comprehensive metabolic panel, Lipid panel  Osteoporosis, unspecified osteoporosis type, unspecified pathological fracture presence  Essential hypertension - Plan: CBC with Differential/Platelet, Comprehensive metabolic panel, quinapril-hydrochlorothiazide (ACCURETIC) 10-12.5 MG tablet, amLODipine (NORVASC) 10 MG tablet  Hyperlipidemia, unspecified hyperlipidemia type - Plan: Lipid panel, atorvastatin (LIPITOR) 40 MG tablet  Allergic rhinitis due to pollen, unspecified chronicity, unspecified seasonality  Cataract of both eyes, unspecified cataract type     Discussed monthly self breast exams and yearly mammograms; at least 30 minutes of aerobic activity at least 5 days/week and weight-bearing exercise 2x/week; healthy diet, including goals of calcium and vitamin D intake and alcohol recommendations (less than or equal to 1 drink/day) reviewed; regular seatbelt use;  Immunization recommendations .  Colonoscopy recommendations reviewed   Medicare Attestation I have personally reviewed: The patient's medical and social history Heruse of alcohol, tobacco or illicit drugs Her current medications and supplements The patient's functional ability including ADLs,fall risks, home safety risks, cognitive, and hearing and visual impairment Diet and physical activities Evidence for  depression or mood disorders  The patient's weight, height, and BMI have been recorded in the chart.  I have made referrals, counseling, and provided education to the patient based on review of the above and I have provided the patient with a written personalized care plan for preventive services.     Carollee HerterLALONDE,JOHN CHARLES, MD   10/31/2016

## 2016-10-31 NOTE — Patient Instructions (Signed)
  Kristina Maldonado , Thank you for taking time to come for your Medicare Wellness Visit. I appreciate your ongoing commitment to your health goals. Please review the following plan we discussed and let me know if I can assist you in the future.   These are the goals we discussed: Continue to take good care of yourself  This is a list of the screening recommended for you and due dates:  Health Maintenance  Topic Date Due  . Mammogram  02/13/2018  . Colon Cancer Screening  04/23/2018  . Tetanus Vaccine  09/19/2020  . Flu Shot  Completed  . DEXA scan (bone density measurement)  Completed  . Shingles Vaccine  Completed  . Pneumonia vaccines  Completed

## 2017-01-04 ENCOUNTER — Ambulatory Visit (INDEPENDENT_AMBULATORY_CARE_PROVIDER_SITE_OTHER): Payer: Medicare Other | Admitting: Family Medicine

## 2017-01-04 ENCOUNTER — Encounter: Payer: Self-pay | Admitting: Family Medicine

## 2017-01-04 VITALS — BP 126/82 | Wt 199.0 lb

## 2017-01-04 DIAGNOSIS — E785 Hyperlipidemia, unspecified: Secondary | ICD-10-CM

## 2017-01-04 DIAGNOSIS — R252 Cramp and spasm: Secondary | ICD-10-CM

## 2017-01-04 DIAGNOSIS — L309 Dermatitis, unspecified: Secondary | ICD-10-CM

## 2017-01-04 NOTE — Progress Notes (Signed)
   Subjective:    Patient ID: Kristina Maldonado, female    DOB: Jun 30, 1941, 76 y.o.   MRN: [id number]  HPI She is here for consult. She has been having some leg cramps and thinks it's related to the Lipitor. She also has concerns over possibly having diabetes. She also has some lesions present on the base of the thumb that are slightly itching.   Review of Systems     Objective:   Physical Exam Alert and in no distress. Slightly erythematous lesions noted at the base of both thumbs.       Assessment & Plan:  Leg cramps  Hyperlipidemia, unspecified hyperlipidemia type  Dermatitis I explained that the leg cramps are probably not coming from the Lipitor. Recommend she try taking the Lipitor in the morning to see if it has affected. Recommend cortisone cream for the nonspecific skin rash. Explained that her last blood draw showed her blood sugar in the normal range and therefore screen for diabetes was done at that point.

## 2017-01-08 ENCOUNTER — Telehealth: Payer: Self-pay | Admitting: Family Medicine

## 2017-01-08 NOTE — Telephone Encounter (Signed)
Kristina LaniusPam Maldonado (pt dtr) called and left message she wants to talk about pt DNR and Healthcare POA. Kristina is on HIPAA.  Please call Kristina [phone]

## 2017-01-08 NOTE — Telephone Encounter (Signed)
LMTCB

## 2017-01-11 NOTE — Telephone Encounter (Signed)
Pam states that she has already spoken with someone else about this. Trixie Rude/RLB

## 2017-02-14 DIAGNOSIS — Z1231 Encounter for screening mammogram for malignant neoplasm of breast: Secondary | ICD-10-CM | POA: Diagnosis not present

## 2017-02-14 LAB — HM MAMMOGRAPHY

## 2017-02-20 ENCOUNTER — Encounter: Payer: Self-pay | Admitting: Family Medicine

## 2017-03-01 DIAGNOSIS — H40051 Ocular hypertension, right eye: Secondary | ICD-10-CM | POA: Diagnosis not present

## 2017-03-01 DIAGNOSIS — H40023 Open angle with borderline findings, high risk, bilateral: Secondary | ICD-10-CM | POA: Diagnosis not present

## 2017-03-26 ENCOUNTER — Ambulatory Visit (INDEPENDENT_AMBULATORY_CARE_PROVIDER_SITE_OTHER): Payer: Medicare Other | Admitting: Family Medicine

## 2017-03-26 VITALS — BP 132/80 | HR 76 | Resp 18 | Wt 197.6 lb

## 2017-03-26 DIAGNOSIS — S86811A Strain of other muscle(s) and tendon(s) at lower leg level, right leg, initial encounter: Secondary | ICD-10-CM

## 2017-03-26 NOTE — Progress Notes (Signed)
   Subjective:    Patient ID: Kristina GibsonDottie M Betty, female    DOB: Mar 12, 1941, 76 y.o.   MRN: [id number]  HPI She has a one-week history of right lateral calf discomfort does get worse when she walks. She has no history of injury but upon further questioning she has been doing some exercising that did require ankle flexing type maneuvers. No other joints are involved. She has tried Con[employer]ay as well as an OTC med. She has concerns over DVT.   Review of Systems     Objective:   Physical Exam Alert and in no distress. No palpable tenderness. Negative Homans sign. Full motion of the ankle without pain. Slight discomfort over the peroneals.       Assessment & Plan:  Strain of calf muscle, right, initial encounter I explained that I saw no evidence of DVT and think that she probably strained the peroneals with her exercise regimen. Recommend she back off slightly on this but continued to try to exercise. She does have a trip planned to [place] in approximately one month

## 2017-05-31 ENCOUNTER — Other Ambulatory Visit: Payer: Self-pay | Admitting: Family Medicine

## 2017-05-31 NOTE — Telephone Encounter (Signed)
Let her know that she has had over 5 years of this medication and there is no need to continue

## 2017-05-31 NOTE — Telephone Encounter (Signed)
Okay. Find out how long she has been on this med

## 2017-05-31 NOTE — Telephone Encounter (Signed)
Dr.[name] since 03/2011 just whats in epic

## 2017-05-31 NOTE — Telephone Encounter (Signed)
Is this okay to refill? 

## 2017-06-12 ENCOUNTER — Telehealth: Payer: Self-pay | Admitting: Family Medicine

## 2017-06-12 NOTE — Telephone Encounter (Signed)
Pt returned Kristina Maldonado's call, advised per Dr. Jola Babinski note no need to take medication.  But I see that Fosamax was just refilled.  Please clarify do you want pt to stop the Fosamax?  And I will cancel refill

## 2017-06-19 NOTE — Telephone Encounter (Signed)
Per Dr. Susann Givens pt is to stop Fosamax.  Pt informed & I Called [facility] & cancelled refill

## 2017-09-03 ENCOUNTER — Other Ambulatory Visit: Payer: Self-pay | Admitting: Family Medicine

## 2017-09-03 DIAGNOSIS — I10 Essential (primary) hypertension: Secondary | ICD-10-CM

## 2017-09-10 DIAGNOSIS — H2513 Age-related nuclear cataract, bilateral: Secondary | ICD-10-CM | POA: Diagnosis not present

## 2017-09-10 DIAGNOSIS — H40023 Open angle with borderline findings, high risk, bilateral: Secondary | ICD-10-CM | POA: Diagnosis not present

## 2017-09-10 DIAGNOSIS — H40051 Ocular hypertension, right eye: Secondary | ICD-10-CM | POA: Diagnosis not present

## 2017-09-10 DIAGNOSIS — H25013 Cortical age-related cataract, bilateral: Secondary | ICD-10-CM | POA: Diagnosis not present

## 2017-09-10 LAB — HM DIABETES EYE EXAM

## 2017-09-11 ENCOUNTER — Encounter: Payer: Self-pay | Admitting: Family Medicine

## 2017-09-28 ENCOUNTER — Ambulatory Visit (INDEPENDENT_AMBULATORY_CARE_PROVIDER_SITE_OTHER): Payer: Medicare Other | Admitting: Family Medicine

## 2017-09-28 ENCOUNTER — Encounter: Payer: Self-pay | Admitting: Family Medicine

## 2017-09-28 VITALS — BP 128/82 | HR 62 | Wt 198.0 lb

## 2017-09-28 DIAGNOSIS — H8111 Benign paroxysmal vertigo, right ear: Secondary | ICD-10-CM

## 2017-09-28 NOTE — Patient Instructions (Signed)

## 2017-09-28 NOTE — Progress Notes (Signed)
   Subjective:    Patient ID: Kristina GibsonDottie M Marley, female    DOB: Feb 23, 1941, 76 y.o.   MRN: [id number]  HPI She woke up this morning and got out of bed and noted the onset of dizziness.  The dizziness lasted approximately 30 minutes.  She had no blurred or double vision, nausea, vomiting or heart rate changes.  No weakness.  She did note that when she turns her head to the right the dizziness got worse.  Presently she is having no difficulties.   Review of Systems     Objective:   Physical Exam Alert and in no distress.  Full motion of the neck without pain or dizziness.  No carotid bruits noted.  Cardiac exam shows regular rhythm without murmurs or gallops.  Lungs are clear to auscultation.  DTRs are normal.       Assessment & Plan:  Benign paroxysmal positional vertigo of right ear I discussed the diagnosis of positional vertigo.  At the present time she is having no difficulty so no intervention needed.  She continues have difficulty will consider Epley maneuvers.

## 2017-11-21 ENCOUNTER — Ambulatory Visit (INDEPENDENT_AMBULATORY_CARE_PROVIDER_SITE_OTHER): Payer: Medicare Other | Admitting: Family Medicine

## 2017-11-21 ENCOUNTER — Encounter: Payer: Self-pay | Admitting: Family Medicine

## 2017-11-21 VITALS — BP 116/80 | HR 76 | Resp 16 | Ht 65.0 in | Wt 199.6 lb

## 2017-11-21 DIAGNOSIS — Z0000 Encounter for general adult medical examination without abnormal findings: Secondary | ICD-10-CM | POA: Diagnosis not present

## 2017-11-21 DIAGNOSIS — Z1211 Encounter for screening for malignant neoplasm of colon: Secondary | ICD-10-CM

## 2017-11-21 DIAGNOSIS — Z79899 Other long term (current) drug therapy: Secondary | ICD-10-CM

## 2017-11-21 DIAGNOSIS — I10 Essential (primary) hypertension: Secondary | ICD-10-CM

## 2017-11-21 DIAGNOSIS — J301 Allergic rhinitis due to pollen: Secondary | ICD-10-CM | POA: Diagnosis not present

## 2017-11-21 DIAGNOSIS — E785 Hyperlipidemia, unspecified: Secondary | ICD-10-CM | POA: Diagnosis not present

## 2017-11-21 LAB — CBC WITH DIFFERENTIAL/PLATELET
BASOS ABS: 0.1 10*3/uL (ref 0.0–0.2)
Basos: 1 %
EOS (ABSOLUTE): 0.1 10*3/uL (ref 0.0–0.4)
Eos: 1 %
HEMOGLOBIN: 13.8 g/dL (ref 11.1–15.9)
Hematocrit: 42.9 % (ref 34.0–46.6)
IMMATURE GRANS (ABS): 0 10*3/uL (ref 0.0–0.1)
Immature Granulocytes: 0 %
LYMPHS: 39 %
Lymphocytes Absolute: 1.6 10*3/uL (ref 0.7–3.1)
MCH: 31.2 pg (ref 26.6–33.0)
MCHC: 32.2 g/dL (ref 31.5–35.7)
MCV: 97 fL (ref 79–97)
MONOCYTES: 6 %
Monocytes Absolute: 0.3 10*3/uL (ref 0.1–0.9)
Neutrophils Absolute: 2.1 10*3/uL (ref 1.4–7.0)
Neutrophils: 53 %
Platelets: 218 10*3/uL (ref 150–379)
RBC: 4.42 x10E6/uL (ref 3.77–5.28)
RDW: 14.3 % (ref 12.3–15.4)
WBC: 4.1 10*3/uL (ref 3.4–10.8)

## 2017-11-21 LAB — COMPREHENSIVE METABOLIC PANEL
ALBUMIN: 4.2 g/dL (ref 3.5–4.8)
ALK PHOS: 75 IU/L (ref 39–117)
ALT: 11 IU/L (ref 0–32)
AST: 24 IU/L (ref 0–40)
Albumin/Globulin Ratio: 1.4 (ref 1.2–2.2)
BUN / CREAT RATIO: 13 (ref 12–28)
BUN: 12 mg/dL (ref 8–27)
Bilirubin Total: 0.5 mg/dL (ref 0.0–1.2)
CO2: 24 mmol/L (ref 20–29)
CREATININE: 0.92 mg/dL (ref 0.57–1.00)
Calcium: 10.2 mg/dL (ref 8.7–10.3)
Chloride: 104 mmol/L (ref 96–106)
GFR calc non Af Amer: 62 mL/min/{1.73_m2} (ref >59)
GFR, EST AFRICAN AMERICAN: 71 mL/min/{1.73_m2} (ref >59)
Globulin, Total: 2.9 g/dL (ref 1.5–4.5)
Glucose: 95 mg/dL (ref 65–99)
Potassium: 4.2 mmol/L (ref 3.5–5.2)
Sodium: 143 mmol/L (ref 134–144)
TOTAL PROTEIN: 7.1 g/dL (ref 6.0–8.5)

## 2017-11-21 LAB — LIPID PANEL
CHOLESTEROL TOTAL: 223 mg/dL — AB (ref 100–199)
Chol/HDL Ratio: 1.9 ratio (ref 0.0–4.4)
HDL: 116 mg/dL (ref >39)
LDL CALC: 94 mg/dL (ref 0–99)
Triglycerides: 66 mg/dL (ref 0–149)
VLDL CHOLESTEROL CAL: 13 mg/dL (ref 5–40)

## 2017-11-21 MED ORDER — AMLODIPINE BESYLATE 10 MG PO TABS: 10.0000 mg | ORAL_TABLET | Freq: Every day | ORAL | 3 refills | Status: DC

## 2017-11-21 MED ORDER — QUINAPRIL-HYDROCHLOROTHIAZIDE 10-12.5 MG PO TABS: 1.0000 | ORAL_TABLET | Freq: Every day | ORAL | 3 refills | Status: DC

## 2017-11-21 MED ORDER — ATORVASTATIN CALCIUM 40 MG PO TABS: 40.0000 mg | ORAL_TABLET | Freq: Every day | ORAL | 3 refills | Status: DC

## 2017-11-21 NOTE — Progress Notes (Signed)
Kristina Maldonado is a 77 y.o. female who presents for annual wellness visit and follow-up on chronic medical conditions.  She has the following concerns: joint stiffness  Immunizations and Health Maintenance Immunization History  Administered Date(s) Administered  . Influenza Split 08/22/2010, 09/11/2011, 09/03/2012  . Influenza, High Dose Seasonal PF 07/08/2013, 07/14/2014, 09/21/2015  . Influenza-Unspecified 08/01/2016, 06/28/2017  . Pneumococcal Conjugate-13 07/14/2014  . Pneumococcal Polysaccharide-23 07/07/2009  . Td 08/02/1999  . Tdap 09/19/2010  . Zoster 09/24/2006  . Zoster Recombinat (Shingrix) 12/29/2016, 04/18/2017   There are no preventive care reminders to display for this patient.  Last Pap smear: hysterectomy Last mammogram: 01/2017 Last colonoscopy: 04/2008 Last DEXA: 05/2016 Dentist: none Ophtho: Dr. Alben Spittle at [facility]   Exercise:  Pt states she is up moving around the house  Other doctors caring for patient include: no others  Advanced directives:  Yes per pt.  Does Patient Have a Medical Advance Directive?: Yes Does patient want to make changes to medical advance directive?: No - Patient declined  Depression screen:  See questionnaire below.  Depression screen [facility] 2/9 11/21/2017 10/31/2016 10/26/2015 10/21/2014 10/10/2013  Decreased Interest 0 0 0 0 0  Down, Depressed, Hopeless 0 0 0 0 0  PHQ - 2 Score 0 0 0 0 0    Fall Risk Screen: see questionnaire below. Fall Risk  11/21/2017 10/31/2016 10/26/2015 10/21/2014 10/10/2013  Falls in the past year? No No No No No    ADL screen:  See questionnaire below Functional Status Survey: Is the patient deaf or have difficulty hearing?: No Does the patient have difficulty seeing, even when wearing glasses/contacts?: No Does the patient have difficulty concentrating, remembering, or making decisions?: No Does the patient have difficulty walking or climbing stairs?: No Does the patient have difficulty dressing or bathing?:  No Does the patient have difficulty doing errands alone such as visiting a doctor's office or shopping?: No   Review of Systems Constitutional: -, -unexpected weight change, -anorexia, -fatigue Allergy: -sneezing, -itching, -congestion Dermatology: denies changing moles, rash, lumps ENT: -runny nose, -ear pain, -sore throat,  Cardiology:  -chest pain, -palpitations, -orthopnea, Respiratory: -cough, -shortness of breath, -dyspnea on exertion, -wheezing,  Gastroenterology: -abdominal pain, -nausea, -vomiting, -diarrhea, -constipation, -dysphagia Hematology: -bleeding or bruising problems Musculoskeletal: -arthralgias, -myalgias, -joint swelling, -back pain, - Ophthalmology: -vision changes,  Urology: -dysuria, -difficulty urinating,  -urinary frequency, -urgency, incontinence Neurology: -, -numbness, , -memory loss, -falls, -dizziness    PHYSICAL EXAM:   General Appearance: Alert, cooperative, no distress, appears stated age Head: Normocephalic, without obvious abnormality, atraumatic Eyes: PERRL, conjunctiva/corneas clear, EOM's intact, fundi benign Ears: Normal TM's and external ear canals Nose: Nares normal, mucosa normal, no drainage or sinus tenderness Throat: Lips, mucosa, and tongue normal; teeth and gums normal Neck: Supple, no lymphadenopathy;  thyroid:  no enlargement/tenderness/nodules; no carotid bruit or JVD Lungs: Clear to auscultation bilaterally without wheezes, rales or ronchi; respirations unlabored Heart: Regular rate and rhythm, S1 and S2 normal, no murmur, rubor gallop Abdomen: Soft, non-tender, nondistended, normoactive bowel sounds,  no masses, no hepatosplenomegaly Extremities: No clubbing, cyanosis or edema Pulses: 2+ and symmetric all extremities Skin:  Skin color, texture, turgor normal, no rashes or lesions Lymph nodes: Cervical, supraclavicular, and axillary nodes normal Neurologic:  CNII-XII intact, normal strength, sensation and gait; reflexes 2+ and  symmetric throughout Psych: Normal mood, affect, hygiene and grooming.  ASSESSMENT/PLAN: Routine general medical examination at a health care facility - Plan: CBC with Differential/Platelet, Comprehensive metabolic panel, Lipid panel  Essential  hypertension - Plan: CBC with Differential/Platelet, Comprehensive metabolic panel, amLODipine (NORVASC) 10 MG tablet, quinapril-hydrochlorothiazide (ACCURETIC) 10-12.5 MG tablet  Hyperlipidemia, unspecified hyperlipidemia type - Plan: Lipid panel, atorvastatin (LIPITOR) 40 MG tablet  Allergic rhinitis due to pollen, unspecified seasonality  Encounter for long-term (current) use of medications - Plan: CBC with Differential/Platelet, Comprehensive metabolic panel, Lipid panel  Screening for colon cancer - Plan: Cologuard  Health maintenance issues in regard to diet, exercise, mammogram, colonoscopy was discussed with her.  Encouraged her to continue with her physical activities.     Medicare Attestation I have personally reviewed: The patient's medical and social history Their use of alcohol, tobacco or illicit drugs Their current medications and supplements The patient's functional ability including ADLs,fall risks, home safety risks, cognitive, and hearing and visual impairment Diet and physical activities Evidence for depression or mood disorders  The patient's weight, height, and BMI have been recorded in the chart.  I have made referrals, counseling, and provided education to the patient based on review of the above and I have provided the patient with a written personalized care plan for preventive services.     Sharlot GowdaJohn Milo Solana, MD   11/21/2017

## 2017-11-21 NOTE — Patient Instructions (Signed)
  Ms. Kristina Maldonado , Thank you for taking time to come for your Medicare Wellness Visit. I appreciate your ongoing commitment to your health goals. Please review the following plan we discussed and let me know if I can assist you in the future.   These are the goals we discussed: Goals    None      This is a list of the screening recommended for you and due dates:  Health Maintenance  Topic Date Due  . Colon Cancer Screening  04/23/2018  . Mammogram  02/15/2019  . Tetanus Vaccine  09/19/2020  . Flu Shot  Completed  . DEXA scan (bone density measurement)  Completed  . Pneumonia vaccines  Completed

## 2017-11-22 ENCOUNTER — Telehealth: Payer: Self-pay

## 2017-11-22 NOTE — Telephone Encounter (Signed)
Pt called to ask about her cologuard test. Pt was advised that info will be sent over to her insurance and once they respond [facility] will send her a kit to her home for the sample with instructions. Pt was also advised that it could take a week or two before she could hear from them. Thanks [employer]

## 2017-11-28 ENCOUNTER — Telehealth: Payer: Self-pay | Admitting: Family Medicine

## 2017-11-28 NOTE — Telephone Encounter (Signed)
Pt was notified.  

## 2017-11-28 NOTE — Telephone Encounter (Signed)
Pt called and stated she was given the cologuard test. She would like to start that over the weekend. She is experiencing constipation over the last few days. She would like some recommendations. Pt uses [facility] on [address] She can be reached at [phone].

## 2017-11-28 NOTE — Telephone Encounter (Signed)
Have her use a dose of milk of magnesia

## 2017-12-07 ENCOUNTER — Other Ambulatory Visit: Payer: Self-pay | Admitting: Family Medicine

## 2017-12-07 ENCOUNTER — Telehealth: Payer: Self-pay

## 2017-12-07 DIAGNOSIS — E785 Hyperlipidemia, unspecified: Secondary | ICD-10-CM

## 2017-12-07 DIAGNOSIS — I10 Essential (primary) hypertension: Secondary | ICD-10-CM

## 2017-12-07 NOTE — Telephone Encounter (Signed)
Called pt to let her know that her cologuard test could not be processed and another test will be sent to her within 3-7 days to redo. Thanks [employer]

## 2017-12-07 NOTE — Telephone Encounter (Signed)
Pt just had a cpe done

## 2017-12-31 DIAGNOSIS — Z1212 Encounter for screening for malignant neoplasm of rectum: Secondary | ICD-10-CM | POA: Diagnosis not present

## 2017-12-31 DIAGNOSIS — Z1211 Encounter for screening for malignant neoplasm of colon: Secondary | ICD-10-CM | POA: Diagnosis not present

## 2018-01-02 ENCOUNTER — Telehealth: Payer: Self-pay | Admitting: Family Medicine

## 2018-01-02 NOTE — Telephone Encounter (Signed)
New Message  Pt verbalized she is wanting to talk to nurse in regards to her taking OTC medication and she still have mucus in her throat.  Please f/u with pat

## 2018-01-02 NOTE — Telephone Encounter (Signed)
Pt was called back and no answer. Left message to call office back. KH

## 2018-01-02 NOTE — Telephone Encounter (Signed)
Pt called back to let us know that she is feeling much better and she thinks the meds have started working so she does not need a call back

## 2018-01-02 NOTE — Telephone Encounter (Signed)
Ok thanks [employer]

## 2018-01-08 LAB — HM COLONOSCOPY

## 2018-01-14 ENCOUNTER — Encounter: Payer: Self-pay | Admitting: Family Medicine

## 2018-02-14 ENCOUNTER — Encounter: Payer: Self-pay | Admitting: Family Medicine

## 2018-02-19 DIAGNOSIS — Z1231 Encounter for screening mammogram for malignant neoplasm of breast: Secondary | ICD-10-CM | POA: Diagnosis not present

## 2018-02-19 DIAGNOSIS — Z803 Family history of malignant neoplasm of breast: Secondary | ICD-10-CM | POA: Diagnosis not present

## 2018-02-19 LAB — HM MAMMOGRAPHY

## 2018-03-04 DIAGNOSIS — H40051 Ocular hypertension, right eye: Secondary | ICD-10-CM | POA: Diagnosis not present

## 2018-03-04 DIAGNOSIS — H40023 Open angle with borderline findings, high risk, bilateral: Secondary | ICD-10-CM | POA: Diagnosis not present

## 2018-03-07 ENCOUNTER — Encounter: Payer: Self-pay | Admitting: Internal Medicine

## 2018-03-21 ENCOUNTER — Ambulatory Visit (INDEPENDENT_AMBULATORY_CARE_PROVIDER_SITE_OTHER): Payer: Medicare Other | Admitting: Family Medicine

## 2018-03-21 ENCOUNTER — Encounter: Payer: Self-pay | Admitting: Family Medicine

## 2018-03-21 VITALS — BP 136/82 | HR 80 | Temp 98.0°F | Ht 65.0 in | Wt 197.8 lb

## 2018-03-21 DIAGNOSIS — Z712 Person consulting for explanation of examination or test findings: Secondary | ICD-10-CM

## 2018-03-21 NOTE — Progress Notes (Signed)
   Subjective:    Patient ID: Kristina Maldonado, female    DOB: 05-29-1941, 77 y.o.   MRN: [id number]  HPI She is here for consult concerning her recent Cologuard.  The tested, negative however she was not informed of this.   Review of Systems     Objective:   Physical Exam  Alert and in no distress otherwise not examined      Assessment & Plan:  Person consulting for explanation of examination or test findings I explained that the test was negative and that we can potentially follow-up in 3 years by doing this test again.  She is now 77 I also discussed the fact that after age 80 the recommendations are no further testing are needed.  We can rediscuss this in about a year week she comes in for her complete exam.

## 2018-07-29 ENCOUNTER — Ambulatory Visit (INDEPENDENT_AMBULATORY_CARE_PROVIDER_SITE_OTHER): Payer: Medicare Other | Admitting: Family Medicine

## 2018-07-29 ENCOUNTER — Encounter: Payer: Self-pay | Admitting: Family Medicine

## 2018-07-29 VITALS — BP 126/84 | HR 73 | Temp 98.1°F | Wt 197.2 lb

## 2018-07-29 DIAGNOSIS — R42 Dizziness and giddiness: Secondary | ICD-10-CM

## 2018-07-29 DIAGNOSIS — I10 Essential (primary) hypertension: Secondary | ICD-10-CM

## 2018-07-29 DIAGNOSIS — J301 Allergic rhinitis due to pollen: Secondary | ICD-10-CM | POA: Diagnosis not present

## 2018-07-29 NOTE — Progress Notes (Signed)
   Subjective:    Patient ID: Kristina Maldonado, female    DOB: Aug 07, 1941, 77 y.o.   MRN: [id number]  HPI She complains of a 3-week history of dizziness that she notes mainly in the morning.  She describes it as feeling off balance but cannot relate this to eating.  No blurred vision, double vision, headache, heart rate changes and she does have underlying allergies and has had some difficulty with sneezing, rhinorrhea, PND as well as no earache.  She presently is on an unknown allergy medication.  Review of Systems     Objective:   Physical Exam Alert and in no distress.  EOMI.  Cerebellar testing negative.  Normal cerebellar ,tympanic membranes and canals are normal. Pharyngeal area is normal. Neck is supple without adenopathy or thyromegaly. Cardiac exam shows a regular sinus rhythm without murmurs or gallops. Lungs are clear to auscultation.        Assessment & Plan:  Dizziness  Essential hypertension  Allergic rhinitis due to pollen, unspecified seasonality I explained that at this point I saw no major issue for her dizziness other than possibly allergy related.  Recommend using an antihistamine for this.  If she continues have difficulty she is to return here.  Explained that I did not see any red flags for any major concerns.

## 2018-07-29 NOTE — Patient Instructions (Signed)
Try regular dose of either loratadine or fexofenadine

## 2018-09-04 ENCOUNTER — Ambulatory Visit: Payer: Medicare Other | Admitting: Podiatry

## 2018-09-04 ENCOUNTER — Encounter: Payer: Self-pay | Admitting: Podiatry

## 2018-09-04 VITALS — BP 153/91 | HR 85

## 2018-09-04 DIAGNOSIS — M79675 Pain in left toe(s): Secondary | ICD-10-CM

## 2018-09-04 DIAGNOSIS — L84 Corns and callosities: Secondary | ICD-10-CM

## 2018-09-04 NOTE — Patient Instructions (Addendum)
Corns and Calluses Corns are small areas of thickened skin that occur on the top, sides, or tip of a toe. They contain a cone-shaped core with a point that can press on a nerve below. This causes pain. Calluses are areas of thickened skin that can occur anywhere on the body including hands, fingers, palms, soles of the feet, and heels.Calluses are usually larger than corns. What are the causes? Corns and calluses are caused by rubbing (friction) or pressure, such as from shoes that are too tight or do not fit properly. What increases the risk? Corns are more likely to develop in people who have toe deformities, such as hammer toes. Since calluses can occur with friction to any area of the skin, calluses are more likely to develop in people who: Work with their hands. Wear shoes that fit poorly, shoes that are too tight, or shoes that are high-heeled. Have toes deformities.  What are the signs or symptoms? Symptoms of a corn or callus include: A hard growth on the skin. Pain or tenderness under the skin. Redness and swelling. Increased discomfort while wearing tight-fitting shoes.  How is this diagnosed? Corns and calluses may be diagnosed with a medical history and physical exam. How is this treated? Corns and calluses may be treated with: Removing the cause of the friction or pressure. This may include: Changing your shoes. Wearing shoe inserts (orthotics) or other protective layers in your shoes, such as a corn pad. Wearing gloves. Medicines to help soften skin in the hardened, thickened areas. Reducing the size of the corn or callus by removing the dead layers of skin. Antibiotic medicines to treat infection. Surgery, if a toe deformity is the cause.  Follow these instructions at home: Take medicines only as directed by your health care provider. If you were prescribed an antibiotic, finish all of it even if you start to feel better. Wear shoes that fit well. Avoid wearing  high-heeled shoes and shoes that are too tight or too loose. Wear any padding, protective layers, gloves, or orthotics as directed by your health care provider. Soak your hands or feet and then use a file or pumice stone to soften your corn or callus. Do this as directed by your health care provider. Check your corn or callus every day for signs of infection. Watch for: Redness, swelling, or pain. Fluid, blood, or pus. Contact a health care provider if: Your symptoms do not improve with treatment. You have increased redness, swelling, or pain at the site of your corn or callus. You have fluid, blood, or pus coming from your corn or callus. You have new symptoms. This information is not intended to replace advice given to you by your health care provider. Make sure you discuss any questions you have with your health care provider. Document Released: 07/15/2004 Document Revised: 04/28/2016 Document Reviewed: 10/05/2014 Elsevier Interactive Patient Education  2018 Elsevier Inc. Hammer Toe Hammer toe is a change in the shape (a deformity) of your second, third, or fourth toe. The deformity causes the middle joint of your toe to stay bent. This causes pain, especially when you are wearing shoes. Hammer toe starts gradually. At first, the toe can be straightened. Gradually over time, the deformity becomes stiff and permanent. Early treatments to keep the toe straight may relieve pain. As the deformity becomes stiff and permanent, surgery may be needed to straighten the toe. What are the causes? Hammer toe is caused by abnormal bending of the toe joint that is closest  to your foot. It happens gradually over time. This pulls on the muscles and connections (tendons) of the toe joint, making them weak and stiff. It is often related to wearing shoes that are too short or narrow and do not let your toes straighten. What increases the risk? You may be at greater risk for hammer toe if you: Are female. Are  older. Wear shoes that are too small. Wear high-heeled shoes that pinch your toes. Are a Advertising account planner. Have a second toe that is longer than your big toe (first toe). Injure your foot or toe. Have arthritis. Have a family history of hammer toe. Have a nerve or muscle disorder.  What are the signs or symptoms? The main symptoms of this condition are pain and deformity of the toe. The pain is worse when wearing shoes, walking, or running. Other symptoms may include: Corns or calluses over the bent part of the toe or between the toes. Redness and a burning feeling on the toe. An open sore that forms on the top of the toe. Not being able to straighten the toe.  How is this diagnosed? This condition is diagnosed based on your symptoms and a physical exam. During the exam, your health care provider will try to straighten your toe to see how stiff the deformity is. You may also have tests, such as: A blood test to check for rheumatoid arthritis. An X-ray to show how severe the deformity is.  How is this treated? Treatment for this condition will depend on how stiff the deformity is. Surgery is often needed. However, sometimes a hammer toe can be straightened without surgery. Treatments that do not involve surgery include: Taping the toe into a straightened position. Using pads and cushions to protect the toe (orthotics). Wearing shoes that provide enough room for the toes. Doing toe-stretching exercises at home. Taking an NSAID to reduce pain and swelling.  If these treatments do not help or the toe cannot be straightened, surgery is the next option. The most common surgeries used to straighten a hammer toe include: Arthroplasty. In this procedure, part of the joint is removed, and that allows the toe to straighten. Fusion. In this procedure, cartilage between the two bones of the joint is taken out and the bones are fused together into one longer bone. Implantation. In this procedure,  part of the bone is removed and replaced with an implant to let the toe move again. Flexor tendon transfer. In this procedure, the tendons that curl the toes down (flexor tendons) are repositioned.  Follow these instructions at home: Take over-the-counter and prescription medicines only as told by your health care provider. Do toe straightening and stretching exercises as told by your health care provider. Keep all follow-up visits as told by your health care provider. This is important. How is this prevented? Wear shoes that give your toes enough room and do not cause pain. Do not wear high-heeled shoes. Contact a health care provider if: Your pain gets worse. Your toe becomes red or swollen. You develop an open sore on your toe. This information is not intended to replace advice given to you by your health care provider. Make sure you discuss any questions you have with your health care provider. Document Released: 10/06/2000 Document Revised: 04/28/2016 Document Reviewed: 02/02/2016 Elsevier Interactive Patient Education  [employer]. 1.

## 2018-09-05 ENCOUNTER — Other Ambulatory Visit: Payer: Self-pay | Admitting: Family Medicine

## 2018-09-05 DIAGNOSIS — I10 Essential (primary) hypertension: Secondary | ICD-10-CM

## 2018-09-12 DIAGNOSIS — H2513 Age-related nuclear cataract, bilateral: Secondary | ICD-10-CM | POA: Diagnosis not present

## 2018-09-12 DIAGNOSIS — H25013 Cortical age-related cataract, bilateral: Secondary | ICD-10-CM | POA: Diagnosis not present

## 2018-09-12 DIAGNOSIS — H40023 Open angle with borderline findings, high risk, bilateral: Secondary | ICD-10-CM | POA: Diagnosis not present

## 2018-09-12 LAB — HM DIABETES EYE EXAM

## 2018-09-28 ENCOUNTER — Encounter: Payer: Self-pay | Admitting: Podiatry

## 2018-09-28 NOTE — Progress Notes (Signed)
Subjective: Kristina Maldonado presents cc of painful corn left 5th digit for the past month. She states pain is present when wearing enclosed shoe gear.   Pain is getting progressively worse. She has tried using over the counter medications/pads which did not provide relief.  Patient is seeking professional help regarding symptoms.  Past Medical History:  Diagnosis Date  . Allergy   . Dyslipidemia   . Hyperlipidemia   . Hypertension   . Obesity   . Osteopenia     Patient Active Problem List   Diagnosis Date Noted  . Cataract of both eyes 10/31/2016  . Allergic rhinitis due to pollen 09/25/2011  . Encounter for long-term (current) use of medications 04/06/2011  . Osteoporosis 04/06/2011  . Hypertension 04/06/2011  . Hyperlipidemia 04/06/2011    Past Surgical History:  Procedure Laterality Date  . ABDOMINAL HYSTERECTOMY    . COLONOSCOPY  2009   [facility]  . Hammertoe surgery 5th toe Right      Current Outpatient Medications:  .  amLODipine (NORVASC) 10 MG tablet, Take 1 tablet (10 mg total) by mouth daily., Disp: 90 tablet, Rfl: 3 .  aspirin 81 MG tablet, Take 81 mg by mouth daily.  , Disp: , Rfl:  .  atorvastatin (LIPITOR) 40 MG tablet, Take 1 tablet (40 mg total) by mouth daily., Disp: 90 tablet, Rfl: 3 .  Calcium-Magnesium-Vitamin D (CALCIUM 1200+D3 PO), Take 600 mg by mouth once., Disp: , Rfl:  .  Cholecalciferol (VITAMIN D3) 3000 units TABS, Take 4,000 Units by mouth daily., Disp: , Rfl:  .  FLUAD 0.5 ML SUSY, ADM 0.5ML IM UTD, Disp: , Rfl: 0 .  Multiple Vitamins-Minerals (MULTIVITAMIN WITH MINERALS) tablet, Take 1 tablet by mouth daily. Reported on 03/22/2016, Disp: , Rfl:  .  Omega-3 1000 MG CAPS, Take by mouth., Disp: , Rfl:  .  Potassium 99 MG TABS, Take 99 mg by mouth once., Disp: , Rfl:  .  quinapril-hydrochlorothiazide (ACCURETIC) 10-12.5 MG tablet, TAKE 1 TABLET BY MOUTH  DAILY, Disp: 90 tablet, Rfl: 2  No Known Allergies  Social History   Occupational History  .  Not on file  Tobacco Use  . Smoking status: Never Smoker  . Smokeless tobacco: Never Used  Substance and Sexual Activity  . Alcohol use: No  . Drug use: No  . Sexual activity: Not Currently    Family History  Problem Relation Age of Onset  . CVA Mother   . Hypertension Mother   . Hypertension Father     Immunization History  Administered Date(s) Administered  . Influenza Split 08/22/2010, 09/11/2011, 09/03/2012  . Influenza, High Dose Seasonal PF 07/08/2013, 07/14/2014, 09/21/2015, 06/28/2017  . Influenza-Unspecified 08/01/2016, 06/28/2017  . Pneumococcal Conjugate-13 07/14/2014  . Pneumococcal Polysaccharide-23 07/07/2009  . Td 08/02/1999  . Tdap 09/19/2010  . Zoster 09/24/2006  . Zoster Recombinat (Shingrix) 12/29/2016, 04/18/2017    Review of systems: Positive Findings in bold print.  Constitutional:  chills, fatigue, fever, sweats, weight change Communication: Nurse, learning disability, sign Presenter, broadcasting, hand writing, iPad/Android device Eyes: diplopia, glare,  light sensitivity, eyeglasses, blindness Ears nose mouth throat: Hard of hearing, deaf, sign language,  vertigo,   bloody nose,  rhinitis, cold sores, snoring Cardiovascular: HTN, edema, arrhythmia, pacemaker in place, defibrillator in place,  chest pain/tightness, chronic anticoagulation, blood clot Respiratory:  difficulty breathing, denies congestion, SOB, wheezing, cough Gastrointestinal: abdominal pain, diarrhea, nausea, vomiting,  Genitourinary:  nocturia,  pain on urination,  blood in urine, Foley catheter, urinary urgency Musculoskeletal: Uses  mobility aid,  cramping, stiff joints, painful joints,  Skin: changes in toenails, color change dryness, itchy skin, mole changes, or rash  Neurological: numbness, paresthesias, burning in feet, denies fainting,  seizure, change in speech. denies headaches, memory problems/poor historian, cerebral palsy Endocrine: diabetes, hypothyroidism, hyperthyroidism,  dry mouth,  flushing, denies heat intolerance,  cold intolerance,  excessive thirst, denies polyuria,  nocturia Hematological:  easy bleeding,  excessive bleeding, easy bruising, enlarged lymph nodes, on long term blood thinner Allergy/immunological:  hive, frequent infections, multiple drug allergies, seasonal allergies,  Psychiatric:  anxiety, depression, mood disorder, suicidal ideations, hallucinations   Objective: Vitals:   09/04/18 0850  BP: (!) 153/91  Pulse: 85   Vascular Examination: Capillary refill time immediate x 10 digits Dorsalis pedis palpable b/l Posterior tibial pulses nonpalpable b/l No digital hair x 10 digits Skin temperature WNL b/l  Dermatological Examination: Skin atrophy b/l  Toenails adequate length and well manicured  Hyperkeratotic lesion dorsal 5th PIPJ left foot  Musculoskeletal: Muscle strength 5/5 to all LE muscle groups  Hammertoe deformity left 5th digit  Neurological: Sensation intact with 10 gram monofilament  Vibratory sensation intact.  Assessment: 1. Painful corn left 5th toe 2. Hammertoe deformity left 5th digit  Plan: 1. Discussed diagnosis and treatment options. Patient agreed with debridement of corn. 2. Corn left 5th digit pared with sterile chisel blade and gently smoothed with burr. 3. Patient to continue soft, supportive shoe gear 4. Patient to report any pedal injuries to medical professional immediately. 5. Follow up 3 months. Patient/POA to call should there be a concern in the interim.

## 2018-11-21 ENCOUNTER — Other Ambulatory Visit: Payer: Self-pay

## 2018-11-21 NOTE — Patient Outreach (Signed)
[facility]) Care Management  11/21/2018  Kristina Maldonado 1941-03-22 [id number]   Medication Adherence call to Kristina Maldonado left a message for patient to call back patient is due on Atorvastatin 40 mg and Quinapril/HCTZ 10/12.5 mg. Kristina Maldonado is showing past due under [place] [employer].   Lillia Abed CPhT Pharmacy Technician [facility] Management Direct Dial [phone]  Fax [phone] [email or web address]

## 2018-11-22 ENCOUNTER — Ambulatory Visit: Payer: Medicare Other | Admitting: Family Medicine

## 2018-11-26 ENCOUNTER — Encounter: Payer: Self-pay | Admitting: Family Medicine

## 2018-11-26 ENCOUNTER — Ambulatory Visit (INDEPENDENT_AMBULATORY_CARE_PROVIDER_SITE_OTHER): Payer: Medicare Other | Admitting: Family Medicine

## 2018-11-26 VITALS — BP 122/80 | HR 67 | Ht 65.5 in | Wt 201.4 lb

## 2018-11-26 DIAGNOSIS — Z79899 Other long term (current) drug therapy: Secondary | ICD-10-CM | POA: Diagnosis not present

## 2018-11-26 DIAGNOSIS — Z0000 Encounter for general adult medical examination without abnormal findings: Secondary | ICD-10-CM

## 2018-11-26 DIAGNOSIS — E669 Obesity, unspecified: Secondary | ICD-10-CM

## 2018-11-26 DIAGNOSIS — E785 Hyperlipidemia, unspecified: Secondary | ICD-10-CM

## 2018-11-26 DIAGNOSIS — J301 Allergic rhinitis due to pollen: Secondary | ICD-10-CM

## 2018-11-26 DIAGNOSIS — H269 Unspecified cataract: Secondary | ICD-10-CM | POA: Diagnosis not present

## 2018-11-26 DIAGNOSIS — I10 Essential (primary) hypertension: Secondary | ICD-10-CM

## 2018-11-26 LAB — COMPREHENSIVE METABOLIC PANEL
ALBUMIN: 4.1 g/dL (ref 3.7–4.7)
ALK PHOS: 79 IU/L (ref 39–117)
ALT: 12 IU/L (ref 0–32)
AST: 22 IU/L (ref 0–40)
Albumin/Globulin Ratio: 1.4 (ref 1.2–2.2)
BILIRUBIN TOTAL: 0.4 mg/dL (ref 0.0–1.2)
BUN / CREAT RATIO: 16 (ref 12–28)
BUN: 17 mg/dL (ref 8–27)
CO2: 24 mmol/L (ref 20–29)
CREATININE: 1.04 mg/dL — AB (ref 0.57–1.00)
Calcium: 10.2 mg/dL (ref 8.7–10.3)
Chloride: 103 mmol/L (ref 96–106)
GFR calc Af Amer: 61 mL/min/{1.73_m2} (ref >59)
GFR, EST NON AFRICAN AMERICAN: 53 mL/min/{1.73_m2} — AB (ref >59)
GLOBULIN, TOTAL: 3 g/dL (ref 1.5–4.5)
Glucose: 86 mg/dL (ref 65–99)
Potassium: 4.1 mmol/L (ref 3.5–5.2)
SODIUM: 143 mmol/L (ref 134–144)
Total Protein: 7.1 g/dL (ref 6.0–8.5)

## 2018-11-26 LAB — POCT URINALYSIS DIP (PROADVANTAGE DEVICE)
BILIRUBIN UA: NEGATIVE mg/dL
Bilirubin, UA: NEGATIVE
Blood, UA: NEGATIVE
Glucose, UA: NEGATIVE mg/dL
Nitrite, UA: NEGATIVE
PROTEIN UA: NEGATIVE mg/dL
SPECIFIC GRAVITY, URINE: 1.015
Urobilinogen, Ur: NEGATIVE
pH, UA: 7 (ref 5.0–8.0)

## 2018-11-26 LAB — CBC WITH DIFFERENTIAL/PLATELET
BASOS ABS: 0 10*3/uL (ref 0.0–0.2)
Basos: 1 %
EOS (ABSOLUTE): 0.1 10*3/uL (ref 0.0–0.4)
Eos: 2 %
HEMATOCRIT: 42.9 % (ref 34.0–46.6)
HEMOGLOBIN: 14.4 g/dL (ref 11.1–15.9)
Immature Grans (Abs): 0 10*3/uL (ref 0.0–0.1)
Immature Granulocytes: 0 %
LYMPHS ABS: 1.9 10*3/uL (ref 0.7–3.1)
Lymphs: 39 %
MCH: 32 pg (ref 26.6–33.0)
MCHC: 33.6 g/dL (ref 31.5–35.7)
MCV: 95 fL (ref 79–97)
MONOCYTES: 9 %
MONOS ABS: 0.4 10*3/uL (ref 0.1–0.9)
NEUTROS ABS: 2.4 10*3/uL (ref 1.4–7.0)
Neutrophils: 49 %
Platelets: 189 10*3/uL (ref 150–450)
RBC: 4.5 x10E6/uL (ref 3.77–5.28)
RDW: 13.1 % (ref 11.7–15.4)
WBC: 4.8 10*3/uL (ref 3.4–10.8)

## 2018-11-26 LAB — LIPID PANEL
CHOL/HDL RATIO: 2 ratio (ref 0.0–4.4)
CHOLESTEROL TOTAL: 218 mg/dL — AB (ref 100–199)
HDL: 107 mg/dL (ref >39)
LDL CALC: 101 mg/dL — AB (ref 0–99)
TRIGLYCERIDES: 52 mg/dL (ref 0–149)
VLDL CHOLESTEROL CAL: 10 mg/dL (ref 5–40)

## 2018-11-26 MED ORDER — QUINAPRIL-HYDROCHLOROTHIAZIDE 10-12.5 MG PO TABS: 1.0000 | ORAL_TABLET | Freq: Every day | ORAL | 2 refills | Status: DC

## 2018-11-26 MED ORDER — AMLODIPINE BESYLATE 10 MG PO TABS: 10.0000 mg | ORAL_TABLET | Freq: Every day | ORAL | 3 refills | Status: DC

## 2018-11-26 MED ORDER — ATORVASTATIN CALCIUM 40 MG PO TABS: 40.0000 mg | ORAL_TABLET | Freq: Every day | ORAL | 3 refills | Status: DC

## 2018-11-26 NOTE — Patient Instructions (Signed)
  Kristina Maldonado , Thank you for taking time to come for your Medicare Wellness Visit. I appreciate your ongoing commitment to your health goals. Please review the following plan we discussed and let me know if I can assist you in the future.   These are the goals we discussed: Goals   None     This is a list of the screening recommended for you and due dates:  Health Maintenance  Topic Date Due  . Flu Shot  05/23/2018  . Tetanus Vaccine  09/19/2020  . Colon Cancer Screening  01/09/2028  . DEXA scan (bone density measurement)  Completed  . Pneumonia vaccines  Completed

## 2018-11-26 NOTE — Progress Notes (Signed)
Kristina GibsonDottie M Musolino is a 78 y.o. female who presents for annual wellness visit,CPE and follow-up on chronic medical conditions.  She has underlying allergies and takes care of these with OTC allergy medications.  She continues on quinapril/HCTZ as well as amlodipine for her blood pressure and is having no troubles with that.  She is also taking omega-3 and atorvastatin for her hyperlipidemia.  She has no complaints concerning this.  She does see ophthalmology regularly for continued monitoring of her cataracts.  She does have a previous history of osteoporosis and was on a bisphosphonate for well over 5 years.  Her most recent DEXA showed osteopenia.  She exercises very little mainly doing push-ups.  She does not smoke or drink.  She has no other concerns or complaints.  Immunizations and Health Maintenance Immunization History  Administered Date(s) Administered  . Influenza Split 08/22/2010, 09/11/2011, 09/03/2012  . Influenza, High Dose Seasonal PF 07/08/2013, 07/14/2014, 09/21/2015, 06/28/2017  . Influenza-Unspecified 08/01/2016, 06/28/2017  . Pneumococcal Conjugate-13 07/14/2014  . Pneumococcal Polysaccharide-23 07/07/2009  . Td 08/02/1999  . Tdap 09/19/2010  . Zoster 09/24/2006  . Zoster Recombinat (Shingrix) 12/29/2016, 04/18/2017   Health Maintenance Due  Topic Date Due  . INFLUENZA VACCINE  05/23/2018    Last Pap smear: hysterectomy Last mammogram: 2019 Last colonoscopy: cologuard last year 2019 Last DEXA: no longer needed per pt Dentist: Dr. Elmer PickerHecker Ophtho: gets them done in April or may Exercise: push ups  Other doctors caring for patient include:[facility]  Advanced directives: Yes.  Copy in chart.    Depression screen:  See questionnaire below.  [facility] 11/26/2018 11/21/2017 10/31/2016 10/26/2015 10/21/2014  Decreased Interest 0 0 0 0 0  Down, Depressed, Hopeless 0 0 0 0 0  PHQ - 2 Score 0 0 0 0 0    Fall Risk Screen: see  questionnaire below. Fall Risk  11/26/2018 11/21/2017 10/31/2016 10/26/2015 10/21/2014  Falls in the past year? 0 No No No No  Number falls in past yr: 0 - - - -  Injury with Fall? 0 - - - -    ADL screen:  See questionnaire below Functional Status Survey: Is the patient deaf or have difficulty hearing?: No Does the patient have difficulty seeing, even when wearing glasses/contacts?: No Does the patient have difficulty concentrating, remembering, or making decisions?: No Does the patient have difficulty walking or climbing stairs?: No Does the patient have difficulty dressing or bathing?: No Does the patient have difficulty doing errands alone such as visiting a doctor's office or shopping?: No   Review of Systems Constitutional: -, -unexpected weight change, -anorexia, -fatigue Allergy: -sneezing, -itching, -congestion Dermatology: denies changing moles, rash, lumps ENT: -runny nose, -ear pain, -sore throat,  Cardiology:  -chest pain, -palpitations, -orthopnea, Respiratory: -cough, -shortness of breath, -dyspnea on exertion, -wheezing,  Gastroenterology: -abdominal pain, -nausea, -vomiting, -diarrhea, -constipation, -dysphagia Hematology: -bleeding or bruising problems Musculoskeletal: -arthralgias, -myalgias, -joint swelling, -back pain, - Ophthalmology: -vision changes,  Urology: -dysuria, -difficulty urinating,  -urinary frequency, -urgency, incontinence Neurology: -, -numbness, , -memory loss, -falls, -dizziness    PHYSICAL EXAM:   General Appearance: Alert, cooperative, no distress, appears stated age Head: Normocephalic, without obvious abnormality, atraumatic Eyes: PERRL, conjunctiva/corneas clear, EOM's intact, fundi benign Ears: Normal TM's and external ear canals Nose: Nares normal, mucosa normal, no drainage or sinus tenderness Throat: Lips, mucosa, and tongue normal; teeth and gums normal Neck: Supple, no lymphadenopathy;  thyroid:  no enlargement/tenderness/nodules;  no carotid bruit or  JVD Lungs: Clear to auscultation bilaterally without wheezes, rales or ronchi; respirations unlabored Heart: Regular rate and rhythm, S1 and S2 normal, no murmur, rubor gallop Abdomen: Soft, non-tender, nondistended, normoactive bowel sounds,  no masses, no hepatosplenomegaly Extremities: No clubbing, cyanosis or edema Pulses: 2+ and symmetric all extremities Skin:  Skin color, texture, turgor normal, no rashes or lesions Lymph nodes: Cervical, supraclavicular, and axillary nodes normal Neurologic:  CNII-XII intact, normal strength, sensation and gait; reflexes 2+ and symmetric throughout Psych: Normal mood, affect, hygiene and grooming.  ASSESSMENT/PLAN: Routine general medical examination at a health care facility - Plan: POCT Urinalysis DIP (Proadvantage Device), CBC with Differential/Platelet, Comprehensive metabolic panel, Lipid panel  Allergic rhinitis due to pollen, unspecified seasonality  Cataract of both eyes, unspecified cataract type  Encounter for long-term (current) use of medications - Plan: CBC with Differential/Platelet, Comprehensive metabolic panel, Lipid panel  Hyperlipidemia, unspecified hyperlipidemia type - Plan: Lipid panel, atorvastatin (LIPITOR) 40 MG tablet  Essential hypertension - Plan: CBC with Differential/Platelet, Comprehensive metabolic panel, amLODipine (NORVASC) 10 MG tablet, quinapril-hydrochlorothiazide (ACCURETIC) 10-12.5 MG tablet     ; at least 30 minutes of aerobic activity at least 5 days/week and weight-bearing exercise 2x/week;  healthy diet, including goals of calcium and vitamin D intake  Immunization recommendations discussed.  Colonoscopy recommendations reviewed Did encourage her to become more physically active with 20 minutes of something physical daily or 150 minutes a week of something.  Medicare Attestation I have personally reviewed: The patient's medical and social history Their use of alcohol, tobacco or  illicit drugs Their current medications and supplements The patient's functional ability including ADLs,fall risks, home safety risks, cognitive, and hearing and visual impairment Diet and physical activities Evidence for depression or mood disorders  The patient's weight, height, and BMI have been recorded in the chart.  I have made referrals, counseling, and provided education to the patient based on review of the above and I have provided the patient with a written personalized care plan for preventive services.     Sharlot Gowda, MD   11/26/2018

## 2018-12-05 ENCOUNTER — Encounter: Payer: Self-pay | Admitting: Podiatry

## 2018-12-05 ENCOUNTER — Ambulatory Visit: Payer: Medicare Other | Admitting: Podiatry

## 2018-12-05 DIAGNOSIS — M2042 Other hammer toe(s) (acquired), left foot: Secondary | ICD-10-CM | POA: Diagnosis not present

## 2018-12-05 DIAGNOSIS — L84 Corns and callosities: Secondary | ICD-10-CM | POA: Insufficient documentation

## 2018-12-05 NOTE — Progress Notes (Signed)
Subjective:  Patient presents to clinic with cc of  painful corn left 5th digit which is aggravated when wearing enclosed shoe gear.  This pain limits her daily activities and ability to wear certain types of shoes. Pain symptoms resolve with periodic professional debridement.  Although her pain symptoms resolve with periodic debridement, she would like more definitive treatment.  She would like to discuss her surgical options for correction of her hammertoe deformity left fifth digit to relieve her painful corn.  Ronnald Nian, MD is her PCP.  Her last visit with him was November 26, 2018   Current Outpatient Medications:  .  amLODipine (NORVASC) 10 MG tablet, Take 1 tablet (10 mg total) by mouth daily., Disp: 90 tablet, Rfl: 3 .  aspirin 81 MG tablet, Take 81 mg by mouth daily.  , Disp: , Rfl:  .  atorvastatin (LIPITOR) 40 MG tablet, Take 1 tablet (40 mg total) by mouth daily., Disp: 90 tablet, Rfl: 3 .  Calcium-Magnesium-Vitamin D (CALCIUM 1200+D3 PO), Take 600 mg by mouth once., Disp: , Rfl:  .  Cholecalciferol (VITAMIN D3) 3000 units TABS, Take 4,000 Units by mouth daily., Disp: , Rfl:  .  Multiple Vitamins-Minerals (MULTIVITAMIN WITH MINERALS) tablet, Take 1 tablet by mouth daily. Reported on 03/22/2016, Disp: , Rfl:  .  Omega-3 1000 MG CAPS, Take by mouth., Disp: , Rfl:  .  Potassium 99 MG TABS, Take 99 mg by mouth once., Disp: , Rfl:  .  quinapril-hydrochlorothiazide (ACCURETIC) 10-12.5 MG tablet, Take 1 tablet by mouth daily., Disp: 90 tablet, Rfl: 2   No Known Allergies   Objective:  Vascular Examination: Capillary refill time immediate x 10 digits Dorsalis pedis palpable b/l Posterior tibial pulses nonpalpable b/l No digital hair x 10 digits Skin temperature WNL b/l  Dermatological Examination: Skin atrophy b/l  Toenails adequate length and well manicured  Hyperkeratotic lesion dorsal 5th PIPJ left foot  Musculoskeletal: Muscle strength 5/5 to all LE muscle  groups  Hammertoe deformity left 5th digit  Neurological: Sensation intact with 10 gram monofilament  Vibratory sensation intact.  Assessment: 1. Painful corn left 5th toe 2. Hammertoe deformity left 5th digit  Plan: Painful corn left fifth digit was pared today with sterile scalpel blade without incident.  We briefly discussed surgical correction of her hammertoe.  I will send her for surgical consultation with Dr. Samuella Cota.  She can see Dr. Samuella Cota in 2 weeks for the consultation and she will see me in 3 months for follow-up. Continue soft, supportive shoe gear daily. Call our office should there be any questions/concerns in the interim.

## 2018-12-05 NOTE — Patient Instructions (Signed)
Onychomycosis/Fungal Toenails  WHAT IS IT? An infection that lies within the keratin of your nail plate that is caused by a fungus.  WHY ME? Fungal infections affect all ages, sexes, races, and creeds.  There may be many factors that predispose you to a fungal infection such as age, coexisting medical conditions such as diabetes, or an autoimmune disease; stress, medications, fatigue, genetics, etc.  Bottom line: fungus thrives in a warm, moist environment and your shoes offer such a location.  IS IT CONTAGIOUS? Theoretically, yes.  You do not want to share shoes, nail clippers or files with someone who has fungal toenails.  Walking around barefoot in the same room or sleeping in the same bed is unlikely to transfer the organism.  It is important to realize, however, that fungus can spread easily from one nail to the next on the same foot.  HOW DO WE TREAT THIS?  There are several ways to treat this condition.  Treatment may depend on many factors such as age, medications, pregnancy, liver and kidney conditions, etc.  It is best to ask your doctor which options are available to you.  1. No treatment.   Unlike many other medical concerns, you can live with this condition.  However for many people this can be a painful condition and may lead to ingrown toenails or a bacterial infection.  It is recommended that you keep the nails cut short to help reduce the amount of fungal nail. 2. Topical treatment.  These range from herbal remedies to prescription strength nail lacquers.  About 40-50% effective, topicals require twice daily application for approximately 9 to 12 months or until an entirely new nail has grown out.  The most effective topicals are medical grade medications available through physicians offices. 3. Oral antifungal medications.  With an 80-90% cure rate, the most common oral medication requires 3 to 4 months of therapy and stays in your system for a year as the new nail grows out.  Oral  antifungal medications do require blood work to make sure it is a safe drug for you.  A liver function panel will be performed prior to starting the medication and after the first month of treatment.  It is important to have the blood work performed to avoid any harmful side effects.  In general, this medication safe but blood work is required. 4. Laser Therapy.  This treatment is performed by applying a specialized laser to the affected nail plate.  This therapy is noninvasive, fast, and non-painful.  It is not covered by insurance and is therefore, out of pocket.  The results have been very good with a 80-95% cure rate.  [facility] is the only practice in the area to offer this therapy. Permanent Nail Avulsion.  Removing the entire nail so that a new nail will not grow back.Corns and Calluses Corns are small areas of thickened skin that occur on the top, sides, or tip of a toe. They contain a cone-shaped core with a point that can press on a nerve below. This causes pain.  Calluses are areas of thickened skin that can occur anywhere on the body, including the hands, fingers, palms, soles of the feet, and heels. Calluses are usually larger than corns. What are the causes? Corns and calluses are caused by rubbing (friction) or pressure, such as from shoes that are too tight or do not fit properly. What increases the risk? Corns are more likely to develop in people who have misshapen   toes (toe deformities), such as hammer toes. Calluses can occur with friction to any area of the skin. They are more likely to develop in people who:  Work with their hands.  Wear shoes that fit poorly, are too tight, or are high-heeled.  Have toe deformities. What are the signs or symptoms? Symptoms of a corn or callus include:  A hard growth on the skin.  Pain or tenderness under the skin.  Redness and swelling.  Increased discomfort while wearing tight-fitting shoes, if your feet are affected. If a  corn or callus becomes infected, symptoms may include:  Redness and swelling that gets worse.  Pain.  Fluid, blood, or pus draining from the corn or callus. How is this diagnosed? Corns and calluses may be diagnosed based on your symptoms, your medical history, and a physical exam. How is this treated? Treatment for corns and calluses may include:  Removing the cause of the friction or pressure. This may involve: ? Changing your shoes. ? Wearing shoe inserts (orthotics) or other protective layers in your shoes, such as a corn pad. ? Wearing gloves.  Applying medicine to the skin (topical medicine) to help soften skin in the hardened, thickened areas.  Removing layers of dead skin with a file to reduce the size of the corn or callus.  Removing the corn or callus with a scalpel or laser.  Taking antibiotic medicines, if your corn or callus is infected.  Having surgery, if a toe deformity is the cause. Follow these instructions at home:   Take over-the-counter and prescription medicines only as told by your health care provider.  If you were prescribed an antibiotic, take it as told by your health care provider. Do not stop taking it even if your condition starts to improve.  Wear shoes that fit well. Avoid wearing high-heeled shoes and shoes that are too tight or too loose.  Wear any padding, protective layers, gloves, or orthotics as told by your health care provider.  Soak your hands or feet and then use a file or pumice stone to soften your corn or callus. Do this as told by your health care provider.  Check your corn or callus every day for symptoms of infection. Contact a health care provider if you:  Notice that your symptoms do not improve with treatment.  Have redness or swelling that gets worse.  Notice that your corn or callus becomes painful.  Have fluid, blood, or pus coming from your corn or callus.  Have new symptoms. Summary  Corns are small areas of  thickened skin that occur on the top, sides, or tip of a toe.  Calluses are areas of thickened skin that can occur anywhere on the body, including the hands, fingers, palms, and soles of the feet. Calluses are usually larger than corns.  Corns and calluses are caused by rubbing (friction) or pressure, such as from shoes that are too tight or do not fit properly.  Treatment may include wearing any padding, protective layers, gloves, or orthotics as told by your health care provider. This information is not intended to replace advice given to you by your health care provider. Make sure you discuss any questions you have with your health care provider. Document Released: 07/15/2004 Document Revised: 08/22/2017 Document Reviewed: 08/22/2017 Elsevier Interactive Patient Education  2019 [employer] Inc.  

## 2018-12-19 ENCOUNTER — Ambulatory Visit: Payer: Medicare Other | Admitting: Podiatry

## 2019-03-06 ENCOUNTER — Ambulatory Visit: Payer: Medicare Other | Admitting: Podiatry

## 2019-03-11 DIAGNOSIS — Z1231 Encounter for screening mammogram for malignant neoplasm of breast: Secondary | ICD-10-CM | POA: Diagnosis not present

## 2019-03-11 DIAGNOSIS — Z803 Family history of malignant neoplasm of breast: Secondary | ICD-10-CM | POA: Diagnosis not present

## 2019-03-11 LAB — HM MAMMOGRAPHY: HM Mammogram: NORMAL (ref 0–4)

## 2019-03-18 DIAGNOSIS — H40023 Open angle with borderline findings, high risk, bilateral: Secondary | ICD-10-CM | POA: Diagnosis not present

## 2019-03-26 ENCOUNTER — Encounter: Payer: Self-pay | Admitting: Podiatry

## 2019-03-26 ENCOUNTER — Ambulatory Visit: Payer: Medicare Other | Admitting: Podiatry

## 2019-03-26 ENCOUNTER — Other Ambulatory Visit: Payer: Self-pay

## 2019-03-26 VITALS — Temp 97.7°F

## 2019-03-26 DIAGNOSIS — B351 Tinea unguium: Secondary | ICD-10-CM | POA: Diagnosis not present

## 2019-03-26 DIAGNOSIS — M79674 Pain in right toe(s): Secondary | ICD-10-CM | POA: Diagnosis not present

## 2019-03-26 DIAGNOSIS — M79675 Pain in left toe(s): Secondary | ICD-10-CM

## 2019-03-26 DIAGNOSIS — L84 Corns and callosities: Secondary | ICD-10-CM | POA: Diagnosis not present

## 2019-03-26 DIAGNOSIS — M2042 Other hammer toe(s) (acquired), left foot: Secondary | ICD-10-CM

## 2019-03-26 NOTE — Patient Instructions (Signed)

## 2019-03-26 NOTE — Patient Outreach (Signed)
[facility]) Care Management  03/26/2019  Kristina Maldonado Mar 12, 1941 [id number]   Medication Adherence call to Kristina Maldonado HIPPA Compliant Voice message left with a call back number. Kristina Maldonado is showing past due on Quanapril/Hczt 10/12.5 mg and Atorvastatin 40 mg under [employer].   Kristina Maldonado CPhT Pharmacy Technician [facility] Management Direct Dial [phone]  Fax [phone] [email or web address]

## 2019-03-30 NOTE — Progress Notes (Signed)
Subjective: Kristina Maldonado presents to clinic with cc of painful mycotic toenails and corn left 5th digit which are aggravated when wearing enclosed shoe gear.  This pain limits her daily activities. Pain symptoms resolve with periodic professional debridement.  Denita Lung, MD is her PCP and last visit was 11/26/2018.   Current Outpatient Medications:  .  amLODipine (NORVASC) 10 MG tablet, Take 1 tablet (10 mg total) by mouth daily., Disp: 90 tablet, Rfl: 3 .  aspirin 81 MG tablet, Take 81 mg by mouth daily.  , Disp: , Rfl:  .  atorvastatin (LIPITOR) 40 MG tablet, Take 1 tablet (40 mg total) by mouth daily., Disp: 90 tablet, Rfl: 3 .  Calcium-Magnesium-Vitamin D (CALCIUM 1200+D3 PO), Take 600 mg by mouth once., Disp: , Rfl:  .  Cholecalciferol (VITAMIN D3) 3000 units TABS, Take 4,000 Units by mouth daily., Disp: , Rfl:  .  Multiple Vitamins-Minerals (MULTIVITAMIN WITH MINERALS) tablet, Take 1 tablet by mouth daily. Reported on 03/22/2016, Disp: , Rfl:  .  Omega-3 1000 MG CAPS, Take by mouth., Disp: , Rfl:  .  Potassium 99 MG TABS, Take 99 mg by mouth once., Disp: , Rfl:  .  quinapril-hydrochlorothiazide (ACCURETIC) 10-12.5 MG tablet, Take 1 tablet by mouth daily., Disp: 90 tablet, Rfl: 2   No Known Allergies   Objective: Vitals:   03/26/19 0818  Temp: 97.7 F (36.5 C)    Physical Examination:  Vascular  Examination: Capillary refill time immediate x 10 digits.  Palpable DP pulses b/l.  Nonpalpable PT pulses b/l.  Digital hair absent b/l.  No edema noted b/l.  Skin temperature gradient WNL b/l.  Dermatological Examination: Skin atrophy noted b/l feet.  No open wounds b/l.  No interdigital macerations noted b/l.  Elongated, thick, discolored brittle toenails with subungual debris and pain on dorsal palpation of nailbeds 1-5 b/l.  Hyperkeratotic lesion dorsal PIPJ left 5th digit with tenderness to palpation. No erythema, no edema, no drainage, no  flocculence.  Musculoskeletal Examination: Muscle strength 5/5 to all muscle groups b/l.  Hammertoe 5th digit left foot.  No pain, crepitus or joint discomfort with active/passive ROM.  Neurological Examination: Sensation intact 5/5 b/l with 10 gram monofilament.  Vibratory sensation intact b/l.  Proprioceptive sensation intact b/l.  Assessment: 1. Mycotic nail infection with pain 1-5 b/l 2. Corn left 5th digit 3. Pain in lesser toe  Plan: 1. Toenails 1-5 b/l were debrided in length and girth without iatrogenic laceration. 2. Painful corn pared utilizing sterile scalpel blade without incident. 3. Continue soft, supportive shoe gear daily. 4. Report any pedal injuries to medical professional. 5. Follow up 3 months. 6. Patient/POA to call should there be a question/concern in there interim.

## 2019-06-25 ENCOUNTER — Ambulatory Visit: Payer: Medicare Other | Admitting: Podiatry

## 2019-06-25 ENCOUNTER — Other Ambulatory Visit: Payer: Self-pay

## 2019-06-25 ENCOUNTER — Encounter: Payer: Self-pay | Admitting: Podiatry

## 2019-06-25 DIAGNOSIS — B351 Tinea unguium: Secondary | ICD-10-CM | POA: Diagnosis not present

## 2019-06-25 DIAGNOSIS — L84 Corns and callosities: Secondary | ICD-10-CM | POA: Diagnosis not present

## 2019-06-25 DIAGNOSIS — M79675 Pain in left toe(s): Secondary | ICD-10-CM

## 2019-06-25 DIAGNOSIS — M79674 Pain in right toe(s): Secondary | ICD-10-CM

## 2019-06-25 NOTE — Patient Instructions (Signed)

## 2019-07-01 NOTE — Progress Notes (Signed)
Subjective: Kristina Maldonado presents to clinic with cc of painful mycotic toenails and painful left 5th digit cornwhich are aggravated when weightbearing with and without shoe gear.  This pain limits her daily activities. Pain symptoms resolve with periodic professional debridement.   Current Outpatient Medications:  .  amLODipine (NORVASC) 10 MG tablet, Take 1 tablet (10 mg total) by mouth daily., Disp: 90 tablet, Rfl: 3 .  aspirin 81 MG tablet, Take 81 mg by mouth daily.  , Disp: , Rfl:  .  atorvastatin (LIPITOR) 40 MG tablet, Take 1 tablet (40 mg total) by mouth daily., Disp: 90 tablet, Rfl: 3 .  Calcium-Magnesium-Vitamin D (CALCIUM 1200+D3 PO), Take 600 mg by mouth once., Disp: , Rfl:  .  Cholecalciferol (VITAMIN D3) 3000 units TABS, Take 4,000 Units by mouth daily., Disp: , Rfl:  .  Multiple Vitamins-Minerals (MULTIVITAMIN WITH MINERALS) tablet, Take 1 tablet by mouth daily. Reported on 03/22/2016, Disp: , Rfl:  .  Omega-3 1000 MG CAPS, Take by mouth., Disp: , Rfl:  .  Potassium 99 MG TABS, Take 99 mg by mouth once., Disp: , Rfl:  .  quinapril-hydrochlorothiazide (ACCURETIC) 10-12.5 MG tablet, Take 1 tablet by mouth daily., Disp: 90 tablet, Rfl: 2   No Known Allergies   Objective: Physical Examination:  Vascular  Examination: Capillary refill time immediate x 10 digits.  Palpable DP pulses b/l.  PT pulses nonpalpable b/l.  Digital hair absent b/l.  No edema noted b/l.  Skin temperature gradient WNL b/l.  Dermatological Examination: Skin atrophy noted b/l.  No open wounds b/l.  No interdigital macerations noted b/l.  Elongated, thick, discolored brittle toenails with subungual debris and pain on dorsal palpation of nailbeds 1-5 b/l.  Hyperkeratotic lesion dorsal 5th PIPJ left foot. No erythema, no edema, no drainage, no flocculence noted.  Musculoskeletal Examination: Muscle strength 5/5 to all muscle groups b/l.  Hammertoe 5th digit left foot.  No pain, crepitus  or joint discomfort with active/passive ROM.  Neurological Examination: Sensation intact 5/5 b/l with 10 gram monofilament.  Vibratory sensation intact b/l.  Proprioceptive sensation intact b/l.  Assessment: 1. Mycotic nail infection with pain 1-5 b/l 2. Corn left 5th digit 3. Pain in toe left foot  Plan: 1. Toenails 1-5 b/l were debrided in length and girth without iatrogenic laceration. 2. Corn(s) left 5th digit pared utilizing sterile scalpel blade without incident. Continue soft, supportive she gear daily. Report any pedal injuries to medical professional. Follow up 3 months. Patient/POA to call should there be a question/concern in there interim.

## 2019-09-04 ENCOUNTER — Other Ambulatory Visit: Payer: Self-pay | Admitting: Family Medicine

## 2019-09-04 DIAGNOSIS — I10 Essential (primary) hypertension: Secondary | ICD-10-CM

## 2019-09-23 DIAGNOSIS — R7309 Other abnormal glucose: Secondary | ICD-10-CM | POA: Diagnosis not present

## 2019-09-23 DIAGNOSIS — H04123 Dry eye syndrome of bilateral lacrimal glands: Secondary | ICD-10-CM | POA: Diagnosis not present

## 2019-09-23 DIAGNOSIS — H40023 Open angle with borderline findings, high risk, bilateral: Secondary | ICD-10-CM | POA: Diagnosis not present

## 2019-09-23 DIAGNOSIS — H2513 Age-related nuclear cataract, bilateral: Secondary | ICD-10-CM | POA: Diagnosis not present

## 2019-09-23 LAB — HM DIABETES EYE EXAM

## 2019-10-01 ENCOUNTER — Other Ambulatory Visit: Payer: Self-pay

## 2019-10-01 ENCOUNTER — Ambulatory Visit: Payer: Medicare Other | Admitting: Podiatry

## 2019-10-01 ENCOUNTER — Encounter: Payer: Self-pay | Admitting: Podiatry

## 2019-10-01 DIAGNOSIS — L84 Corns and callosities: Secondary | ICD-10-CM | POA: Diagnosis not present

## 2019-10-01 DIAGNOSIS — M79675 Pain in left toe(s): Secondary | ICD-10-CM | POA: Diagnosis not present

## 2019-10-01 DIAGNOSIS — M79674 Pain in right toe(s): Secondary | ICD-10-CM

## 2019-10-01 DIAGNOSIS — B351 Tinea unguium: Secondary | ICD-10-CM

## 2019-10-08 NOTE — Progress Notes (Signed)
Subjective:  Kristina Maldonado presents to clinic today for routine foot care with chronic corn left 5th digit and painful mycotic toenails of both feet that become tender and patient cannot cut because of thickness. Pain is aggravated when wearing enclosed shoe gear and relieved with periodic professional debridement.  Patient voices no new pedal concerns on today's visit.  Medications reviewed in chart.  No Known Allergies   Objective:  Physical Examination:  Vascular Examination: Capillary refill time immediate b/l.  Palpable DP pulses b/l.  Nonpalpable PT pulses b/l.   Digital hair absent b/l.  No edema noted b/l.  Skin temperature gradient WNL b/l.  Dermatological Examination: Skin atrophy b/l.   No open wounds b/l.  No interdigital macerations noted b/l.  Elongated, thick, discolored brittle toenails with subungual debris and pain on dorsal palpation of nailbeds 1-5 b/l.   Hyperkeratotic lesion left 5th toe. No erythema, no edema, no drainage, no flocculence noted.  Musculoskeletal Examination: Muscle strength 5/5 to all muscle groups b/l. Hammertoe left 5th digit.  No pain, crepitus or joint discomfort with active/passive ROM.  Neurological Examination: Sensation intact 5/5 b/l with 10 gram monofilament.  Proprioceptive sensation intact b/l.  Assessment: Mycotic nail infection with pain 1-5 b/l Corn left 5th digit Pain in left foot  Plan: 1.  Toenails 1-5 b/l were debrided in length and girth without iatrogenic laceration. 2.  Corn(s) left 5th toe pared utilizing sterile scalpel blade without incident. 3.  Continue soft, supportive shoe gear daily. 4.  Report any pedal injuries to medical professional. 5.  Follow up 3 months. 5.  Patient/POA to call should there be a question/concern in there interim.

## 2019-11-24 ENCOUNTER — Other Ambulatory Visit: Payer: Self-pay | Admitting: Family Medicine

## 2019-11-24 DIAGNOSIS — I10 Essential (primary) hypertension: Secondary | ICD-10-CM

## 2019-11-24 DIAGNOSIS — E785 Hyperlipidemia, unspecified: Secondary | ICD-10-CM

## 2019-11-28 ENCOUNTER — Other Ambulatory Visit: Payer: Self-pay

## 2019-11-28 ENCOUNTER — Encounter: Payer: Self-pay | Admitting: Family Medicine

## 2019-11-28 ENCOUNTER — Ambulatory Visit (INDEPENDENT_AMBULATORY_CARE_PROVIDER_SITE_OTHER): Payer: Medicare Other | Admitting: Family Medicine

## 2019-11-28 VITALS — BP 146/90 | HR 87 | Temp 96.6°F | Ht 65.0 in | Wt 197.2 lb

## 2019-11-28 DIAGNOSIS — M8580 Other specified disorders of bone density and structure, unspecified site: Secondary | ICD-10-CM | POA: Diagnosis not present

## 2019-11-28 DIAGNOSIS — Z780 Asymptomatic menopausal state: Secondary | ICD-10-CM

## 2019-11-28 DIAGNOSIS — I10 Essential (primary) hypertension: Secondary | ICD-10-CM

## 2019-11-28 DIAGNOSIS — E785 Hyperlipidemia, unspecified: Secondary | ICD-10-CM

## 2019-11-28 DIAGNOSIS — J301 Allergic rhinitis due to pollen: Secondary | ICD-10-CM | POA: Diagnosis not present

## 2019-11-28 DIAGNOSIS — Z79899 Other long term (current) drug therapy: Secondary | ICD-10-CM

## 2019-11-28 DIAGNOSIS — M1990 Unspecified osteoarthritis, unspecified site: Secondary | ICD-10-CM

## 2019-11-28 DIAGNOSIS — H269 Unspecified cataract: Secondary | ICD-10-CM | POA: Diagnosis not present

## 2019-11-28 DIAGNOSIS — E669 Obesity, unspecified: Secondary | ICD-10-CM

## 2019-11-28 DIAGNOSIS — Z0000 Encounter for general adult medical examination without abnormal findings: Secondary | ICD-10-CM | POA: Diagnosis not present

## 2019-11-28 NOTE — Progress Notes (Signed)
Kristina Maldonado is a 79 y.o. female who presents for annual wellness visit,CPE and follow-up on chronic medical conditions.  She has concerns over her memory and would like to have further evaluation concerning that.  She also complains of difficulty with constipation and does occasionally use MiraLAX.  She does have cataracts and checks regularly with ophthalmology.  At this time she has not had surgery on it.  She continues to do well on her quinapril/HCTZ and amlodipine for her blood pressure.  She is also taking atorvastatin and having no aches or pains associated with that.  She does complain of arthritis type symptoms but is using topical medications for this.  Her allergies seem to be under good control.  She does have a history of osteopenia but has not been vigilant about taking vitamin D and calcium.  Also her exercise is quite variable depending upon the weather.  Family and social history was reviewed. Immunizations and Health Maintenance Immunization History  Administered Date(s) Administered  . Influenza Split 08/22/2010, 09/11/2011, 09/03/2012  . Influenza, High Dose Seasonal PF 07/08/2013, 07/14/2014, 09/21/2015, 06/28/2017  . Influenza, Seasonal, Injecte, Preservative Fre 06/17/2019  . Influenza-Unspecified 08/01/2016, 06/28/2017  . Pneumococcal Conjugate-13 07/14/2014  . Pneumococcal Polysaccharide-23 07/07/2009  . Td 08/02/1999  . Tdap 09/19/2010  . Zoster 09/24/2006  . Zoster Recombinat (Shingrix) 12/29/2016, 04/18/2017   There are no preventive care reminders to display for this patient.  Last Pap smear: Aged out  Last mammogram: 03/11/19 Last colonoscopy:01/08/18 Last DEXA: 06/09/16 Dentist: over five years Ophtho: 08/2019 Exercise: walking   Other doctors caring for patient include: Dr. Elisha Ponder podiatry, Dr. Kathlen Mody eye   Advanced directives: on file   Does Patient Have a Medical Advance Directive?: Yes Type of Advance Directive: [place] Does  patient want to make changes to medical advance directive?: Yes (MAU/Ambulatory/Procedural Areas - Information given)  Depression screen:  See questionnaire below.  Depression screen [facility] 2/9 11/28/2019 11/26/2018 11/21/2017 10/31/2016 10/26/2015  Decreased Interest 0 0 0 0 0  Down, Depressed, Hopeless 0 0 0 0 0  PHQ - 2 Score 0 0 0 0 0    Fall Risk Screen: see questionnaire below. Fall Risk  11/28/2019 11/26/2018 11/21/2017 10/31/2016 10/26/2015  Falls in the past year? 0 0 No No No  Number falls in past yr: - 0 - - -  Injury with Fall? - 0 - - -    ADL screen:  See questionnaire below Functional Status Survey: Is the patient deaf or have difficulty hearing?: No Does the patient have difficulty seeing, even when wearing glasses/contacts?: No Does the patient have difficulty concentrating, remembering, or making decisions?: No Does the patient have difficulty walking or climbing stairs?: No Does the patient have difficulty dressing or bathing?: No Does the patient have difficulty doing errands alone such as visiting a doctor's office or shopping?: No   Review of Systems Constitutional: -, -unexpected weight change, -anorexia, -fatigue Allergy: -sneezing, -itching, -congestion Dermatology: denies changing moles, rash, lumps ENT: -runny nose, -ear pain, -sore throat,  Cardiology:  -chest pain, -palpitations, -orthopnea, Respiratory: -cough, -shortness of breath, -dyspnea on exertion, -wheezing,  Gastroenterology: -abdominal pain, -nausea, -vomiting, -diarrhea, -constipation, -dysphagia Hematology: -bleeding or bruising problems Musculoskeletal: -arthralgias, -myalgias, -joint swelling, -back pain, - Ophthalmology: -vision changes,  Urology: -dysuria, -difficulty urinating,  -urinary frequency, -urgency, incontinence Neurology: -, -numbness, , -memory loss, -falls, -dizziness    PHYSICAL EXAM:  BP (!) 146/90 (BP Location: Left Arm, Patient Position: Sitting)   Pulse  87   Temp (!) 96.6 F  (35.9 C)   Ht 5\' 5"  (1.651 m)   Wt 197 lb 3.2 oz (89.4 kg)   SpO2 97%   BMI 32.82 kg/m   General Appearance: Alert, cooperative, no distress, appears stated age Head: Normocephalic, without obvious abnormality, atraumatic Eyes: PERRL, conjunctiva/corneas clear, EOM's intact, fundi benign Ears: Normal TM's and external ear canals Nose: Nares normal, mucosa normal, no drainage or sinus tenderness Throat: Lips, mucosa, and tongue normal; teeth and gums normal Neck: Supple, no lymphadenopathy;  thyroid:  no enlargement/tenderness/nodules; no carotid bruit or JVD Lungs: Clear to auscultation bilaterally without wheezes, rales or ronchi; respirations unlabored Heart: Regular rate and rhythm, S1 and S2 normal, no murmur, rubor gallop Abdomen: Soft, non-tender, nondistended, normoactive bowel sounds,  no masses, no hepatosplenomegaly Extremities: No clubbing, cyanosis or edema Pulses: 2+ and symmetric all extremities Skin:  Skin color, texture, turgor normal, no rashes or lesions Lymph nodes: Cervical, supraclavicular, and axillary nodes normal Neurologic:  CNII-XII intact, normal strength, sensation and gait; reflexes 2+ and symmetric throughout Psych: Normal mood, affect, hygiene and grooming. MMSE 28 ASSESSMENT/PLAN: Routine general medical examination at a health care facility - Plan: Comprehensive metabolic panel, CBC with Differential/Platelet, Lipid panel  Cataract of both eyes, unspecified cataract type  Allergic rhinitis due to pollen, unspecified seasonality  Obesity without serious comorbidity, unspecified classification, unspecified obesity type  Osteopenia after menopause - Plan: DG Bone Density, VITAMIN D 25 Hydroxy (Vit-D Deficiency, Fractures)  Essential hypertension  Hyperlipidemia, unspecified hyperlipidemia type  Encounter for long-term (current) use of medications  Arthritis Encouraged her to be physically active with least 20 minutes of something physical  daily or 150 minutes a week of something.  Encourage dietary modification.  She can continue to use the topical medication for her arthritis.  She will continue on her blood pressure medications and statin.  Her allergies seem to be under good control.  I will check a DEXA scan as well as vitamin D level on her. eviewed; healthy diet, including goals of calcium and vitamin D intake.  Immunization recommendations discussed.  Discussed getting at least 1 more mammogram. Medicare Attestation I have personally reviewed: The patient's medical and social history Their use of alcohol, tobacco or illicit drugs Their current medications and supplements The patient's functional ability including ADLs,fall risks, home safety risks, cognitive, and hearing and visual impairment Diet and physical activities Evidence for depression or mood disorders  The patient's weight, height, and BMI have been recorded in the chart.  I have made referrals, counseling, and provided education to the patient based on review of the above and I have provided the patient with a written personalized care plan for preventive services.     , MD   11/28/2019

## 2019-11-28 NOTE — Patient Instructions (Signed)
  Kristina Maldonado , Thank you for taking time to come for your Medicare Wellness Visit. I appreciate your ongoing commitment to your health goals. Please review the following plan we discussed and let me know if I can assist you in the future.   These are the goals we discussed: Goals   None     This is a list of the screening recommended for you and due dates:  Health Maintenance  Topic Date Due  . Tetanus Vaccine  09/19/2020  . Flu Shot  Completed  . DEXA scan (bone density measurement)  Completed  . Pneumonia vaccines  Completed

## 2019-11-29 LAB — COMPREHENSIVE METABOLIC PANEL
ALT: 10 IU/L (ref 0–32)
AST: 20 IU/L (ref 0–40)
Albumin/Globulin Ratio: 1.6 (ref 1.2–2.2)
Albumin: 4.2 g/dL (ref 3.7–4.7)
Alkaline Phosphatase: 82 IU/L (ref 39–117)
BUN/Creatinine Ratio: 9 — ABNORMAL LOW (ref 12–28)
BUN: 9 mg/dL (ref 8–27)
Bilirubin Total: 0.5 mg/dL (ref 0.0–1.2)
CO2: 22 mmol/L (ref 20–29)
Calcium: 10 mg/dL (ref 8.7–10.3)
Chloride: 106 mmol/L (ref 96–106)
Creatinine, Ser: 1.04 mg/dL — ABNORMAL HIGH (ref 0.57–1.00)
GFR calc Af Amer: 60 mL/min/{1.73_m2} (ref >59)
GFR calc non Af Amer: 52 mL/min/{1.73_m2} — ABNORMAL LOW (ref >59)
Globulin, Total: 2.7 g/dL (ref 1.5–4.5)
Glucose: 89 mg/dL (ref 65–99)
Potassium: 4.3 mmol/L (ref 3.5–5.2)
Sodium: 145 mmol/L — ABNORMAL HIGH (ref 134–144)
Total Protein: 6.9 g/dL (ref 6.0–8.5)

## 2019-11-29 LAB — LIPID PANEL
Chol/HDL Ratio: 2.1 ratio (ref 0.0–4.4)
Cholesterol, Total: 229 mg/dL — ABNORMAL HIGH (ref 100–199)
HDL: 107 mg/dL (ref >39)
LDL Chol Calc (NIH): 111 mg/dL — ABNORMAL HIGH (ref 0–99)
Triglycerides: 65 mg/dL (ref 0–149)
VLDL Cholesterol Cal: 11 mg/dL (ref 5–40)

## 2019-11-29 LAB — CBC WITH DIFFERENTIAL/PLATELET
Basophils Absolute: 0.1 10*3/uL (ref 0.0–0.2)
Basos: 1 %
EOS (ABSOLUTE): 0.1 10*3/uL (ref 0.0–0.4)
Eos: 2 %
Hematocrit: 40.3 % (ref 34.0–46.6)
Hemoglobin: 14.2 g/dL (ref 11.1–15.9)
Immature Grans (Abs): 0 10*3/uL (ref 0.0–0.1)
Immature Granulocytes: 0 %
Lymphocytes Absolute: 1.8 10*3/uL (ref 0.7–3.1)
Lymphs: 41 %
MCH: 33.3 pg — ABNORMAL HIGH (ref 26.6–33.0)
MCHC: 35.2 g/dL (ref 31.5–35.7)
MCV: 95 fL (ref 79–97)
Monocytes Absolute: 0.4 10*3/uL (ref 0.1–0.9)
Monocytes: 8 %
Neutrophils Absolute: 2.1 10*3/uL (ref 1.4–7.0)
Neutrophils: 48 %
Platelets: 194 10*3/uL (ref 150–450)
RBC: 4.26 x10E6/uL (ref 3.77–5.28)
RDW: 12.8 % (ref 11.7–15.4)
WBC: 4.4 10*3/uL (ref 3.4–10.8)

## 2019-11-29 LAB — VITAMIN D 25 HYDROXY (VIT D DEFICIENCY, FRACTURES): Vit D, 25-Hydroxy: 27 ng/mL — ABNORMAL LOW (ref 30.0–100.0)

## 2019-12-15 ENCOUNTER — Other Ambulatory Visit: Payer: Self-pay | Admitting: Family Medicine

## 2019-12-15 DIAGNOSIS — Z1231 Encounter for screening mammogram for malignant neoplasm of breast: Secondary | ICD-10-CM

## 2019-12-31 ENCOUNTER — Ambulatory Visit: Payer: Medicare Other | Admitting: Podiatry

## 2019-12-31 ENCOUNTER — Encounter: Payer: Self-pay | Admitting: Podiatry

## 2019-12-31 ENCOUNTER — Other Ambulatory Visit: Payer: Self-pay

## 2019-12-31 VITALS — Temp 96.3°F

## 2019-12-31 DIAGNOSIS — M79675 Pain in left toe(s): Secondary | ICD-10-CM

## 2019-12-31 DIAGNOSIS — B351 Tinea unguium: Secondary | ICD-10-CM | POA: Diagnosis not present

## 2019-12-31 DIAGNOSIS — M79674 Pain in right toe(s): Secondary | ICD-10-CM | POA: Diagnosis not present

## 2019-12-31 DIAGNOSIS — L84 Corns and callosities: Secondary | ICD-10-CM

## 2019-12-31 NOTE — Progress Notes (Signed)
Subjective: Kristina Maldonado presents today for follow up of corn(s) left 5th digit and painful mycotic toenails b/l that are difficult to trim. Pain interferes with ambulation. Aggravating factors include wearing enclosed shoe gear. Pain is relieved with periodic professional debridement.   She voices no new pedal concerns on today's visit.  No Known Allergies   Objective: Vitals:   12/31/19 0908  Temp: (!) 96.3 F (35.7 C)    Pt 79 y.o. year old female  in NAD. AAO x 3.   Vascular Examination:  Capillary fill time to digits <3 seconds b/l. Palpable DP pulses b/l. Nonpalpable PT pulses b/l. Pedal hair absent b/l Skin temperature gradient within normal limits b/l.  Dermatological Examination: No open wounds bilaterally. No interdigital macerations bilaterally. Toenails 1-5 b/l elongated, dystrophic, thickened, crumbly with subungual debris and tenderness to dorsal palpation. Hyperkeratotic lesion(s) dorsal aspect left 5th digit PIPJ.  No erythema, no edema, no drainage, no flocculence.  Musculoskeletal: Normal muscle strength 5/5 to all lower extremity muscle groups bilaterally, no pain crepitus or joint limitation noted with ROM b/l and hammertoes noted to the  L 5th toe and R 5th toe.  Neurological: Protective sensation intact 5/5 intact bilaterally with 10g monofilament b/l Vibratory sensation intact b/l  Assessment: 1. Pain due to onychomycosis of toenails of both feet   2. Corns   3. Pain in left toe(s)    Plan: -Toenails 1-5 b/l were debrided in length and girth with sterile nail nippers and dremel without iatrogenic bleeding.  -Corn(s) debrided left 5th digit without complication or incident. Total number debrided=1. -Patient to continue soft, supportive shoe gear daily. -Patient to report any pedal injuries to medical professional immediately. -Patient/POA to call should there be question/concern in the interim.  Return in about 3 months (around 04/01/2020) for nail and  callus trim.

## 2019-12-31 NOTE — Patient Instructions (Signed)
Hammer Toe  Hammer toe is a change in the shape (a deformity) of your toe. The deformity causes the middle joint of your toe to stay bent. This causes pain, especially when you are wearing shoes. Hammer toe starts gradually. At first, the toe can be straightened. Gradually over time, the deformity becomes stiff and permanent. Early treatments to keep the toe straight may relieve pain. As the deformity becomes stiff and permanent, surgery may be needed to straighten the toe. What are the causes? Hammer toe is caused by abnormal bending of the toe joint that is closest to your foot. It happens gradually over time. This pulls on the muscles and connections (tendons) of the toe joint, making them weak and stiff. It is often related to wearing shoes that are too short or narrow and do not let your toes straighten. What increases the risk? You may be at greater risk for hammer toe if you:  Are female.  Are older.  Wear shoes that are too small.  Wear high-heeled shoes that pinch your toes.  Are a ballet dancer.  Have a second toe that is longer than your big toe (first toe).  Injure your foot or toe.  Have arthritis.  Have a family history of hammer toe.  Have a nerve or muscle disorder. What are the signs or symptoms? The main symptoms of this condition are pain and deformity of the toe. The pain is worse when wearing shoes, walking, or running. Other symptoms may include:  Corns or calluses over the bent part of the toe or between the toes.  Redness and a burning feeling on the toe.  An open sore that forms on the top of the toe.  Not being able to straighten the toe. How is this diagnosed? This condition is diagnosed based on your symptoms and a physical exam. During the exam, your health care provider will try to straighten your toe to see how stiff the deformity is. You may also have tests, such as:  A blood test to check for rheumatoid arthritis.  An X-ray to show how  severe the deformity is. How is this treated? Treatment for this condition will depend on how stiff the deformity is. Surgery is often needed. However, sometimes a hammer toe can be straightened without surgery. Treatments that do not involve surgery include:  Taping the toe into a straightened position.  Using pads and cushions to protect the toe (orthotics).  Wearing shoes that provide enough room for the toes.  Doing toe-stretching exercises at home.  Taking an NSAID to reduce pain and swelling. If these treatments do not help or the toe cannot be straightened, surgery is the next option. The most common surgeries used to straighten a hammer toe include:  Arthroplasty. In this procedure, part of the joint is removed, and that allows the toe to straighten.  Fusion. In this procedure, cartilage between the two bones of the joint is taken out and the bones are fused together into one longer bone.  Implantation. In this procedure, part of the bone is removed and replaced with an implant to let the toe move again.  Flexor tendon transfer. In this procedure, the tendons that curl the toes down (flexor tendons) are repositioned. Follow these instructions at home:  Take over-the-counter and prescription medicines only as told by your health care provider.  Do toe straightening and stretching exercises as told by your health care provider.  Keep all follow-up visits as told by your health care   provider. This is important. How is this prevented?  Wear shoes that give your toes enough room and do not cause pain.  Do not wear high-heeled shoes. Contact a health care provider if:  Your pain gets worse.  Your toe becomes red or swollen.  You develop an open sore on your toe. This information is not intended to replace advice given to you by your health care provider. Make sure you discuss any questions you have with your health care provider. Document Revised: 09/21/2017 Document  Reviewed: 02/02/2016 Elsevier Patient Education  2020 [employer]  

## 2020-03-11 ENCOUNTER — Other Ambulatory Visit: Payer: Self-pay

## 2020-03-11 ENCOUNTER — Ambulatory Visit
Admission: RE | Admit: 2020-03-11 | Discharge: 2020-03-11 | Disposition: A | Discharge status: Home / Self Care | Payer: Medicare Other | Source: Ambulatory Visit | Attending: Family Medicine | Admitting: Family Medicine

## 2020-03-11 DIAGNOSIS — M85852 Other specified disorders of bone density and structure, left thigh: Secondary | ICD-10-CM | POA: Diagnosis not present

## 2020-03-11 DIAGNOSIS — Z1231 Encounter for screening mammogram for malignant neoplasm of breast: Secondary | ICD-10-CM

## 2020-03-11 DIAGNOSIS — Z780 Asymptomatic menopausal state: Secondary | ICD-10-CM | POA: Diagnosis not present

## 2020-03-29 DIAGNOSIS — H40023 Open angle with borderline findings, high risk, bilateral: Secondary | ICD-10-CM | POA: Diagnosis not present

## 2020-04-07 ENCOUNTER — Ambulatory Visit: Payer: Medicare Other | Admitting: Podiatry

## 2020-04-15 ENCOUNTER — Ambulatory Visit: Payer: Medicare Other | Admitting: Podiatry

## 2020-04-15 ENCOUNTER — Encounter: Payer: Self-pay | Admitting: Podiatry

## 2020-04-15 ENCOUNTER — Other Ambulatory Visit: Payer: Self-pay

## 2020-04-15 DIAGNOSIS — B351 Tinea unguium: Secondary | ICD-10-CM | POA: Diagnosis not present

## 2020-04-15 DIAGNOSIS — M79675 Pain in left toe(s): Secondary | ICD-10-CM

## 2020-04-15 DIAGNOSIS — M79674 Pain in right toe(s): Secondary | ICD-10-CM | POA: Diagnosis not present

## 2020-04-15 DIAGNOSIS — L84 Corns and callosities: Secondary | ICD-10-CM

## 2020-04-15 DIAGNOSIS — M2042 Other hammer toe(s) (acquired), left foot: Secondary | ICD-10-CM

## 2020-04-15 NOTE — Patient Instructions (Signed)

## 2020-04-21 NOTE — Progress Notes (Signed)
Subjective: Kristina Maldonado is a 79 y.o. female patient seen today painful corn(s) left 5th digit and painful mycotic toenails b/l that are difficult to trim. Pain interferes with ambulation. Aggravating factors include wearing enclosed shoe gear. Pain is relieved with periodic professional debridement.   Today is her birthday. She will be celebrating with her daughters.  She voices no new pedal problems on today's visit.  Past Medical History:  Diagnosis Date  . Allergy   . Dyslipidemia   . Hyperlipidemia   . Hypertension   . Obesity   . Osteopenia     Patient Active Problem List   Diagnosis Date Noted  . Hammertoe of left foot 12/05/2018  . Corn left 5th digit 12/05/2018  . Obesity without serious comorbidity 11/26/2018  . Cataract of both eyes 10/31/2016  . Allergic rhinitis due to pollen 09/25/2011  . Encounter for long-term (current) use of medications 04/06/2011  . Osteopenia after menopause 04/06/2011  . Hypertension 04/06/2011  . Hyperlipidemia 04/06/2011    Current Outpatient Medications on File Prior to Visit  Medication Sig Dispense Refill  . amLODipine (NORVASC) 10 MG tablet TAKE 1 TABLET BY MOUTH  DAILY 90 tablet 3  . aspirin 81 MG tablet Take 81 mg by mouth daily.      Marland Kitchen atorvastatin (LIPITOR) 40 MG tablet TAKE 1 TABLET BY MOUTH  DAILY 90 tablet 3  . Calcium-Magnesium-Vitamin D (CALCIUM 1200+D3 PO) Take 600 mg by mouth once.    . Cholecalciferol (VITAMIN D3) 3000 units TABS Take 4,000 Units by mouth daily.    Marland Kitchen FLUAD QUADRIVALENT 0.5 ML injection     . Multiple Vitamins-Minerals (MULTIVITAMIN WITH MINERALS) tablet Take 1 tablet by mouth daily. Reported on 03/22/2016    . Omega-3 1000 MG CAPS Take by mouth.    . Potassium 99 MG TABS Take 99 mg by mouth once.    . quinapril-hydrochlorothiazide (ACCURETIC) 10-12.5 MG tablet TAKE 1 TABLET BY MOUTH  DAILY 90 tablet 3   No current facility-administered medications on file prior to visit.    No Known  Allergies  Objective: Physical Exam  General: Patient is a pleasant 79 y.o. African American female in NAD. AAO x 3.   Neurovascular Examination: Neurovascular status unchanged b/l lower extremities. Capillary fill time to digits <3 seconds b/l lower extremities. Palpable DP pulse(s) b/l lower extremities Nonpalpable PT pulse(s) b/l lower extremities. Pedal hair absent. Lower extremity skin temperature gradient within normal limits. No pain with calf compression b/l.   Protective sensation intact 5/5 intact bilaterally with 10g monofilament b/l. Vibratory sensation intact b/l. Proprioception intact bilaterally. Babinski reflex negative b/l. Clonus negative b/l.  Dermatological:  Pedal skin with normal turgor, texture and tone bilaterally. No open wounds bilaterally. No interdigital macerations bilaterally. Toenails 1-5 b/l elongated, discolored, dystrophic, thickened, crumbly with subungual debris and tenderness to dorsal palpation. Hyperkeratotic lesion(s) L 5th toe.  No erythema, no edema, no drainage, no flocculence.  Musculoskeletal:  Normal muscle strength 5/5 to all lower extremity muscle groups bilaterally. No pain crepitus or joint limitation noted with ROM b/l. Hammertoes noted to the L 5th toe and R 5th toe. Patient ambulates independent of any assistive aids.  Assessment and Plan:  1. Pain due to onychomycosis of toenails of both feet   2. Hammertoe of left foot   3. Corn left 5th digit   4. Pain in left toe(s)    -Examined patient. -No new findings. No new orders. -Toenails 1-5 b/l were debrided in length and girth  with sterile nail nippers and dremel without iatrogenic bleeding.  -Corn(s) L 5th toe pared utilizing sterile scalpel blade without complication or incident. Total number debrided=1. -Patient to report any pedal injuries to medical professional immediately. -Patient to continue soft, supportive shoe gear daily. -Patient/POA to call should there be question/concern  in the interim.  Return in about 3 months (around 07/16/2020) for nail and callus trim.  Freddie Breech, DPM

## 2020-06-02 ENCOUNTER — Ambulatory Visit (INDEPENDENT_AMBULATORY_CARE_PROVIDER_SITE_OTHER): Payer: Medicare Other | Admitting: Family Medicine

## 2020-06-02 ENCOUNTER — Encounter: Payer: Self-pay | Admitting: Family Medicine

## 2020-06-02 ENCOUNTER — Other Ambulatory Visit: Payer: Self-pay

## 2020-06-02 VITALS — BP 128/82 | HR 65 | Temp 97.2°F | Wt 192.4 lb

## 2020-06-02 DIAGNOSIS — R609 Edema, unspecified: Secondary | ICD-10-CM

## 2020-06-02 DIAGNOSIS — N3281 Overactive bladder: Secondary | ICD-10-CM

## 2020-06-02 NOTE — Progress Notes (Signed)
   Subjective:    Patient ID: Kristina Maldonado, female    DOB: 08-12-1941, 79 y.o.   MRN: [id number]  HPI She complains of a 1 month history of noting bilateral foot swelling.  She states that in the morning it is much better and as a day goes on does get worse.  She has not had any change in her medications.  She does admit to being more sedentary with the Covid situation.  She has no chest pain, shortness of breath, PND or DOE.  She does wear support hose.  She also complains of symptoms of overactive bladder with urge and she has now wearing a pad to help out.   Review of Systems     Objective:   Physical Exam Alert and in no distress.  Cardiac exam shows regular rhythm without murmurs or gallops.  Lungs clear to auscultation.  Lower extremity exam does show 2+ pitting edema.       Assessment & Plan:  Dependent edema  OAB (overactive bladder) Discussed treatment of the edema and recommended continuing to use his support hose as well as elevate her feet when she sits. I then discussed OAB and treatment.  Explained that when it gets to the point where it is interfering with her ADLs, medications can be used.  She plans to continue with her present use of a pad.

## 2020-07-23 ENCOUNTER — Ambulatory Visit: Payer: Medicare Other | Admitting: Podiatry

## 2020-08-12 ENCOUNTER — Other Ambulatory Visit: Payer: Self-pay

## 2020-08-12 ENCOUNTER — Ambulatory Visit (INDEPENDENT_AMBULATORY_CARE_PROVIDER_SITE_OTHER): Payer: Medicare Other | Admitting: Family Medicine

## 2020-08-12 VITALS — BP 124/82 | HR 71 | Temp 96.3°F | Wt 189.4 lb

## 2020-08-12 DIAGNOSIS — H9319 Tinnitus, unspecified ear: Secondary | ICD-10-CM

## 2020-08-12 DIAGNOSIS — N3941 Urge incontinence: Secondary | ICD-10-CM

## 2020-08-12 LAB — POCT URINALYSIS DIP (PROADVANTAGE DEVICE)
Bilirubin, UA: NEGATIVE
Blood, UA: NEGATIVE
Glucose, UA: NEGATIVE mg/dL
Ketones, POC UA: NEGATIVE mg/dL
Leukocytes, UA: NEGATIVE
Nitrite, UA: NEGATIVE
Protein Ur, POC: NEGATIVE mg/dL
Specific Gravity, Urine: 1.025
Urobilinogen, Ur: 0.2
pH, UA: 5.5 (ref 5.0–8.0)

## 2020-08-12 MED ORDER — MIRABEGRON ER 25 MG PO TB24: 25.0000 mg | ORAL_TABLET | Freq: Every day | ORAL | 0 refills | Status: DC

## 2020-08-12 NOTE — Patient Instructions (Addendum)
Urinary Incontinence  Urinary incontinence refers to a condition in which a person is unable to control where and when to pass urine. A person with this condition will urinate when he or she does not mean to (involuntarily). What are the causes? This condition may be caused by:  Medicines.  Infections.  Constipation.  Overactive bladder muscles.  Weak bladder muscles.  Weak pelvic floor muscles. These muscles provide support for the bladder, intestine, and, in women, the uterus.  Enlarged prostate in men. The prostate is a gland near the bladder. When it gets too big, it can pinch the urethra. With the urethra blocked, the bladder can weaken and lose the ability to empty properly.  Surgery.  Emotional factors, such as anxiety, stress, or post-traumatic stress disorder (PTSD).  Pelvic organ prolapse. This happens in women when organs shift out of place and into the vagina. This shift can prevent the bladder and urethra from working properly. What increases the risk? The following factors may make you more likely to develop this condition:  Older age.  Obesity and physical inactivity.  Pregnancy and childbirth.  Menopause.  Diseases that affect the nerves or spinal cord (neurological diseases).  Long-term (chronic) coughing. This can increase pressure on the bladder and pelvic floor muscles. What are the signs or symptoms? Symptoms may vary depending on the type of urinary incontinence you have. They include:  A sudden urge to urinate, but passing urine involuntarily before you can get to a bathroom (urge incontinence).  Suddenly passing urine with any activity that forces urine to pass, such as coughing, laughing, exercise, or sneezing (stress incontinence).  Needing to urinate often, but urinating only a small amount, or constantly dribbling urine (overflow incontinence).  Urinating because you cannot get to the bathroom in time due to a physical disability, such as  arthritis or injury, or communication and thinking problems, such as Alzheimer disease (functional incontinence). How is this diagnosed? This condition may be diagnosed based on:  Your medical history.  A physical exam.  Tests, such as: ? Urine tests. ? X-rays of your kidney and bladder. ? Ultrasound. ? CT scan. ? Cystoscopy. In this procedure, a health care provider inserts a tube with a light and camera (cystoscope) through the urethra and into the bladder in order to check for problems. ? Urodynamic testing. These tests assess how well the bladder, urethra, and sphincter can store and release urine. There are different types of urodynamic tests, and they vary depending on what the test is measuring. To help diagnose your condition, your health care provider may recommend that you keep a log of when you urinate and how much you urinate. How is this treated? Treatment for this condition depends on the type of incontinence that you have and its cause. Treatment may include:  Lifestyle changes, such as: ? Quitting smoking. ? Maintaining a healthy weight. ? Staying active. Try to get 150 minutes of moderate-intensity exercise every week. Ask your health care provider which activities are safe for you. ? Eating a healthy diet.  Avoid high-fat foods, like fried foods.  Avoid refined carbohydrates like white bread and white rice.  Limit how much alcohol and caffeine you drink.  Increase your fiber intake. Foods such as fresh fruits, vegetables, beans, and whole grains are healthy sources of fiber.  Pelvic floor muscle exercises.  Bladder training, such as lengthening the amount of time between bathroom breaks, or using the bathroom at regular intervals.  Using techniques to suppress bladder urges.   This can include distraction techniques or controlled breathing exercises.  Medicines to relax the bladder muscles and prevent bladder spasms.  Medicines to help slow or prevent the  growth of a man's prostate.  Botox injections. These can help relax the bladder muscles.  Using pulses of electricity to help change bladder reflexes (electrical nerve stimulation).  For women, using a medical device to prevent urine leaks. This is a small, tampon-like, disposable device that is inserted into the urethra.  Injecting collagen or carbon beads (bulking agents) into the urinary sphincter. These can help thicken tissue and close the bladder opening.  Surgery. Follow these instructions at home: Lifestyle  Limit alcohol and caffeine. These can fill your bladder quickly and irritate it.  Keep yourself clean to help prevent odors and skin damage. Ask your doctor about special skin creams and cleansers that can protect the skin from urine.  Consider wearing pads or adult diapers. Make sure to change them regularly, and always change them right after experiencing incontinence. General instructions  Take over-the-counter and prescription medicines only as told by your health care provider.  Use the bathroom about every 3-4 hours, even if you do not feel the need to urinate. Try to empty your bladder completely every time. After urinating, wait a minute. Then try to urinate again.  Make sure you are in a relaxed position while urinating.  If your incontinence is caused by nerve problems, keep a log of the medicines you take and the times you go to the bathroom.  Keep all follow-up visits as told by your health care provider. This is important. Contact a health care provider if:  You have pain that gets worse.  Your incontinence gets worse. Get help right away if:  You have a fever or chills.  You are unable to urinate.  You have redness in your groin area or down your legs. Summary  Urinary incontinence refers to a condition in which a person is unable to control where and when to pass urine.  This condition may be caused by medicines, infection, weak bladder  muscles, weak pelvic floor muscles, enlargement of the prostate (in men), or surgery.  The following factors increase your risk for developing this condition: older age, obesity, pregnancy and childbirth, menopause, neurological diseases, and chronic coughing.  There are several types of urinary incontinence. They include urge incontinence, stress incontinence, overflow incontinence, and functional incontinence.  This condition is usually treated first with lifestyle and behavioral changes, such as quitting smoking, eating a healthier diet, and doing regular pelvic floor exercises. Other treatment options include medicines, bulking agents, medical devices, electrical nerve stimulation, or surgery. This information is not intended to replace advice given to you by your health care provider. Make sure you discuss any questions you have with your health care provider. Document Revised: 10/19/2017 Document Reviewed: 01/18/2017 Elsevier Patient Education  2020 [employer].   Tinnitus Tinnitus refers to hearing a sound when there is no actual source for that sound. This is often described as ringing in the ears. However, people with this condition may hear a variety of noises, in one ear or in both ears. The sounds of tinnitus can be soft, loud, or somewhere in between. Tinnitus can last for a few seconds or can be constant for days. It may go away without treatment and come back at various times. When tinnitus is constant or happens often, it can lead to other problems, such as trouble sleeping and trouble concentrating. Almost everyone experiences  tinnitus at some point. Tinnitus that is long-lasting (chronic) or comes back often (recurs) may require medical attention. What are the causes? The cause of tinnitus is often not known. In some cases, it can result from:  Exposure to loud noises from machinery, music, or other sources.  An object (foreign body) stuck in the ear.  Earwax  buildup.  Drinking alcohol or caffeine.  Taking certain medicines.  Age-related hearing loss. It may also be caused by medical conditions such as:  Ear or sinus infections.  High blood pressure.  Heart diseases.  Anemia.  Allergies.  Meniere's disease.  Thyroid problems.  Tumors.  A weak, bulging blood vessel (aneurysm) near the ear. What are the signs or symptoms? The main symptom of tinnitus is hearing a sound when there is no source for that sound. It may sound like:  Buzzing.  Roaring.  Ringing.  Blowing air.  Hissing.  Whistling.  Sizzling.  Humming.  Running water.  A musical note.  Tapping. Symptoms may affect only one ear (unilateral) or both ears (bilateral). How is this diagnosed? Tinnitus is diagnosed based on your symptoms, your medical history, and a physical exam. Your health care provider may do a thorough hearing test (audiologic exam) if your tinnitus:  Is unilateral.  Causes hearing difficulties.  Lasts 6 months or longer. You may work with a health care provider who specializes in hearing disorders (audiologist). You may be asked questions about your symptoms and how they affect your daily life. You may have other tests done, such as:  CT scan.  MRI.  An imaging test of how blood flows through your blood vessels (angiogram). How is this treated? Treating an underlying medical condition can sometimes make tinnitus go away. If your tinnitus continues, other treatments may include:  Medicines.  Therapy and counseling to help you manage the stress of living with tinnitus.  Sound generators to mask the tinnitus. These include: ? Tabletop sound machines that play relaxing sounds to help you fall asleep. ? Wearable devices that fit in your ear and play sounds or music. ? Acoustic neural stimulation. This involves using headphones to listen to music that contains an auditory signal. Over time, listening to this signal may change  some pathways in your brain and make you less sensitive to tinnitus. This treatment is used for very severe cases when no other treatment is working.  Using hearing aids or cochlear implants if your tinnitus is related to hearing loss. Hearing aids are worn in the outer ear. Cochlear implants are surgically placed in the inner ear. Follow these instructions at home: Managing symptoms      When possible, avoid being in loud places and being exposed to loud sounds.  Wear hearing protection, such as earplugs, when you are exposed to loud noises.  Use a white noise machine, a humidifier, or other devices to mask the sound of tinnitus.  Practice techniques for reducing stress, such as meditation, yoga, or deep breathing. Work with your health care provider if you need help with managing stress.  Sleep with your head slightly raised. This may reduce the impact of tinnitus. General instructions  Do not use stimulants, such as nicotine, alcohol, or caffeine. Talk with your health care provider about other stimulants to avoid. Stimulants are substances that can make you feel alert and attentive by increasing certain activities in the body (such as heart rate and blood pressure). These substances may make tinnitus worse.  Take over-the-counter and prescription medicines only as  told by your health care provider.  Try to get plenty of sleep each night.  Keep all follow-up visits as told by your health care provider. This is important. Contact a health care provider if:  Your tinnitus continues for 3 weeks or longer without stopping.  You develop sudden hearing loss.  Your symptoms get worse or do not get better with home care.  You feel you are not able to manage the stress of living with tinnitus. Get help right away if:  You develop tinnitus after a head injury.  You have tinnitus along with any of the following: ? Dizziness. ? Loss of balance. ? Nausea and vomiting. ? Sudden,  severe headache. These symptoms may represent a serious problem that is an emergency. Do not wait to see if the symptoms will go away. Get medical help right away. Call your local emergency services (911 in the [place] Do not drive yourself to the hospital. Summary  Tinnitus refers to hearing a sound when there is no actual source for that sound. This is often described as ringing in the ears.  Symptoms may affect only one ear (unilateral) or both ears (bilateral).  Use a white noise machine, a humidifier, or other devices to mask the sound of tinnitus.  Do not use stimulants, such as nicotine, alcohol, or caffeine. Talk with your health care provider about other stimulants to avoid. These substances may make tinnitus worse. This information is not intended to replace advice given to you by your health care provider. Make sure you discuss any questions you have with your health care provider. Document Revised: 04/23/2019 Document Reviewed: 07/19/2017 Elsevier Patient Education  2020 [employer].

## 2020-08-12 NOTE — Addendum Note (Signed)
Addended by: Renelda Loma on: 08/12/2020 05:14 PM   Modules accepted: Orders

## 2020-08-12 NOTE — Progress Notes (Signed)
   Subjective:    Patient ID: Kristina Maldonado, female    DOB: [birth year], 79 y.o.   MRN: [id number]  HPI She complains of the relatively sudden onset of urge incontinence.  She states that this has been going on for roughly the last 2 weeks but to a lesser extent probably for the last month.  No dysuria, pain, fever, chills.  Apparently she has had difficulty with OAB symptoms in the past. At the end of the encounter she then mentioned difficulty with ringing in the ears that she states has been going on for 6 months.  She is not sure whether it is worse on one side than the other.  No earache, nausea, dizziness or decreased hearing.   Review of Systems     Objective:   Physical Exam Alert and in no distress.  Both canals are quite narrow but the TMs appear normal.  Urine dipstick was negative.       Assessment & Plan:  Urge incontinence - Plan: mirabegron ER (MYRBETRIQ) 25 MG TB24 tablet  Tinnitus, unspecified laterality Information concerning urge incontinence and tinnitus was given to her.  I explained that this is a relatively quick onset of urge incontinence but will try Myrbetriq however she continues to have difficulty we will need to pursue this more aggressively and look at other options. Discussed the tensions with her and again she will pay more attention to this and we will deal with this with her next visit.

## 2020-09-30 DIAGNOSIS — H40023 Open angle with borderline findings, high risk, bilateral: Secondary | ICD-10-CM | POA: Diagnosis not present

## 2020-09-30 DIAGNOSIS — H2513 Age-related nuclear cataract, bilateral: Secondary | ICD-10-CM | POA: Diagnosis not present

## 2020-09-30 DIAGNOSIS — H25013 Cortical age-related cataract, bilateral: Secondary | ICD-10-CM | POA: Diagnosis not present

## 2020-09-30 DIAGNOSIS — R7309 Other abnormal glucose: Secondary | ICD-10-CM | POA: Diagnosis not present

## 2020-10-21 ENCOUNTER — Other Ambulatory Visit: Payer: Self-pay | Admitting: Family Medicine

## 2020-10-21 DIAGNOSIS — E785 Hyperlipidemia, unspecified: Secondary | ICD-10-CM

## 2020-10-21 DIAGNOSIS — I10 Essential (primary) hypertension: Secondary | ICD-10-CM

## 2020-11-03 DIAGNOSIS — Z1152 Encounter for screening for COVID-19: Secondary | ICD-10-CM | POA: Diagnosis not present

## 2020-11-11 ENCOUNTER — Encounter: Payer: Self-pay | Admitting: Family Medicine

## 2020-11-11 ENCOUNTER — Telehealth (INDEPENDENT_AMBULATORY_CARE_PROVIDER_SITE_OTHER): Payer: Medicare Other | Admitting: Family Medicine

## 2020-11-11 ENCOUNTER — Other Ambulatory Visit (INDEPENDENT_AMBULATORY_CARE_PROVIDER_SITE_OTHER): Payer: Medicare Other

## 2020-11-11 ENCOUNTER — Other Ambulatory Visit: Payer: Self-pay

## 2020-11-11 VITALS — Temp 97.2°F | Wt 196.0 lb

## 2020-11-11 DIAGNOSIS — R0982 Postnasal drip: Secondary | ICD-10-CM

## 2020-11-11 DIAGNOSIS — J3489 Other specified disorders of nose and nasal sinuses: Secondary | ICD-10-CM | POA: Diagnosis not present

## 2020-11-11 DIAGNOSIS — R059 Cough, unspecified: Secondary | ICD-10-CM | POA: Diagnosis not present

## 2020-11-11 LAB — POC COVID19 BINAXNOW: SARS Coronavirus 2 Ag: NEGATIVE

## 2020-11-11 NOTE — Progress Notes (Signed)
Please let her know that her rapid Covid test is negative. We will see what her PCR covid test shows.

## 2020-11-11 NOTE — Progress Notes (Signed)
   Subjective:  Documentation for telephone call only.   The patient was located in her car in our parking lot. 2 patient identifiers used.  The provider was located in the office. The patient did consent to this visit and is aware of possible charges through their insurance for this visit.  The other persons participating in this telemedicine service were her daughter. Time spent on call was 14 minutes and in review of previous records >20 minutes total.  This virtual service is not related to other E/M service within previous 7 days.   Patient ID: Kristina Maldonado, female    DOB: 1941/07/21, 80 y.o.   MRN: [id number]  HPI Chief Complaint  Patient presents with  . cough    Cough and mucous all week so far, sinus pressure    Complains of rhinorrhea, post nasal drainage x 1 week. States she is coughing some at night and in the morning. Clear mucus.   No fever, chills, dizziness, headache, chest pain, palpitations, shortness of breath, abdominal pain, N/V/D.   Received her Covid vaccines, all 3, flu shot.   Reviewed allergies, medications, past medical, surgical, family, and social history.    Review of Systems Pertinent positives and negatives in the history of present illness.     Objective:   Physical Exam Temp (!) 97.2 F (36.2 C)   Wt 196 lb (88.9 kg)   BMI 32.62 kg/m   Alert and oriented and in no acute distress. She is speaking in complete sentences without difficulty. Normal speech, mood and thought process.      Assessment & Plan:  Rhinorrhea  Post-nasal drainage  She is here in our parking lot for a virtual visit and for testing. We will test her for rapid and PCR COVID. No acute distress noted. We discussed symptomatic treatment including Tylenol, salt water gargles, Mucinex or Robitussin. Continue treating her allergies as she usually does. We will be in touch with her results and she will let us know if she is worsening over the next few days.

## 2020-11-13 LAB — SARS-COV-2, NAA 2 DAY TAT

## 2020-11-13 LAB — NOVEL CORONAVIRUS, NAA: SARS-CoV-2, NAA: NOT DETECTED

## 2021-01-14 ENCOUNTER — Other Ambulatory Visit: Payer: Self-pay | Admitting: Family Medicine

## 2021-01-14 DIAGNOSIS — E785 Hyperlipidemia, unspecified: Secondary | ICD-10-CM

## 2021-01-14 DIAGNOSIS — I10 Essential (primary) hypertension: Secondary | ICD-10-CM

## 2021-01-17 NOTE — Telephone Encounter (Signed)
Pt was called and she advised that she did not need any of they refills. kh

## 2021-01-18 ENCOUNTER — Encounter: Payer: Self-pay | Admitting: Family Medicine

## 2021-01-18 ENCOUNTER — Ambulatory Visit (INDEPENDENT_AMBULATORY_CARE_PROVIDER_SITE_OTHER): Payer: Medicare Other | Admitting: Family Medicine

## 2021-01-18 ENCOUNTER — Other Ambulatory Visit: Payer: Self-pay

## 2021-01-18 VITALS — BP 132/82 | HR 78 | Temp 97.0°F | Wt 191.8 lb

## 2021-01-18 DIAGNOSIS — R300 Dysuria: Secondary | ICD-10-CM | POA: Diagnosis not present

## 2021-01-18 LAB — POCT URINALYSIS DIP (PROADVANTAGE DEVICE)
Bilirubin, UA: NEGATIVE
Blood, UA: NEGATIVE
Glucose, UA: NEGATIVE mg/dL
Ketones, POC UA: NEGATIVE mg/dL
Leukocytes, UA: NEGATIVE
Nitrite, UA: NEGATIVE
Protein Ur, POC: NEGATIVE mg/dL
Specific Gravity, Urine: 1.01
Urobilinogen, Ur: 0.2
pH, UA: 6 (ref 5.0–8.0)

## 2021-01-18 MED ORDER — SULFAMETHOXAZOLE-TRIMETHOPRIM 800-160 MG PO TABS: 1.0000 | ORAL_TABLET | Freq: Two times a day (BID) | ORAL | 0 refills | Status: DC

## 2021-01-18 NOTE — Addendum Note (Signed)
Addended by: Renelda Loma on: 01/18/2021 01:36 PM   Modules accepted: Orders

## 2021-01-18 NOTE — Progress Notes (Signed)
   Subjective:    Patient ID: Kristina Maldonado, female    DOB: [birth year], 80 y.o.   MRN: [id number]  HPI She complains of a 1 week history of difficulty with dysuria and frequency.  She has been drinking extra fluids to help keep her self hydrated.  No fever, chills or abdominal pain.   Review of Systems     Objective:   Physical Exam Alert and in no distress otherwise not examined.  Urine dipstick was negative.       Assessment & Plan:  Dysuria - Plan: sulfamethoxazole-trimethoprim (BACTRIM DS) 800-160 MG tablet It could be that her urine dipstick was negative due to her hydration.  Recommend she cut back slightly on her fluid intake as she does have her self well-hydrated.  I will go ahead and treat her for presumed UTI.

## 2021-01-30 ENCOUNTER — Other Ambulatory Visit: Payer: Self-pay | Admitting: Family Medicine

## 2021-01-30 DIAGNOSIS — I10 Essential (primary) hypertension: Secondary | ICD-10-CM

## 2021-01-30 DIAGNOSIS — E785 Hyperlipidemia, unspecified: Secondary | ICD-10-CM

## 2021-02-07 ENCOUNTER — Ambulatory Visit (INDEPENDENT_AMBULATORY_CARE_PROVIDER_SITE_OTHER): Payer: Medicare Other | Admitting: Family Medicine

## 2021-02-07 ENCOUNTER — Other Ambulatory Visit: Payer: Self-pay

## 2021-02-07 ENCOUNTER — Encounter: Payer: Self-pay | Admitting: Family Medicine

## 2021-02-07 VITALS — BP 122/80 | HR 65 | Temp 97.1°F | Ht 65.0 in | Wt 192.0 lb

## 2021-02-07 DIAGNOSIS — E669 Obesity, unspecified: Secondary | ICD-10-CM

## 2021-02-07 DIAGNOSIS — Z79899 Other long term (current) drug therapy: Secondary | ICD-10-CM | POA: Diagnosis not present

## 2021-02-07 DIAGNOSIS — M8580 Other specified disorders of bone density and structure, unspecified site: Secondary | ICD-10-CM

## 2021-02-07 DIAGNOSIS — Z1159 Encounter for screening for other viral diseases: Secondary | ICD-10-CM | POA: Diagnosis not present

## 2021-02-07 DIAGNOSIS — Z0000 Encounter for general adult medical examination without abnormal findings: Secondary | ICD-10-CM

## 2021-02-07 DIAGNOSIS — I10 Essential (primary) hypertension: Secondary | ICD-10-CM

## 2021-02-07 DIAGNOSIS — E785 Hyperlipidemia, unspecified: Secondary | ICD-10-CM | POA: Diagnosis not present

## 2021-02-07 DIAGNOSIS — J301 Allergic rhinitis due to pollen: Secondary | ICD-10-CM

## 2021-02-07 DIAGNOSIS — H269 Unspecified cataract: Secondary | ICD-10-CM

## 2021-02-07 DIAGNOSIS — N3281 Overactive bladder: Secondary | ICD-10-CM

## 2021-02-07 DIAGNOSIS — Z780 Asymptomatic menopausal state: Secondary | ICD-10-CM | POA: Diagnosis not present

## 2021-02-07 MED ORDER — QUINAPRIL-HYDROCHLOROTHIAZIDE 10-12.5 MG PO TABS: 1.0000 | ORAL_TABLET | Freq: Every day | ORAL | 0 refills | Status: DC

## 2021-02-07 MED ORDER — AMLODIPINE BESYLATE 10 MG PO TABS: 1.0000 | ORAL_TABLET | Freq: Every day | ORAL | 3 refills | Status: DC

## 2021-02-07 MED ORDER — ATORVASTATIN CALCIUM 40 MG PO TABS: 1.0000 | ORAL_TABLET | Freq: Every day | ORAL | 3 refills | Status: DC

## 2021-02-07 NOTE — Patient Instructions (Addendum)
  Ms. Portnoy , Thank you for taking time to come for your Medicare Wellness Visit. I appreciate your ongoing commitment to your health goals. Please review the following plan we discussed and let me know if I can assist you in the future.   These are the goals we discussed: Continue on your present medications and try to increase your physical activity. This is a list of the screening recommended for you and due dates:  Health Maintenance  Topic Date Due  .  Hepatitis C: One time screening is recommended by Center for Disease Control  (CDC) for  adults born from [birth year] through 1963.   Never done  . COVID-19 Vaccine (3 - Booster for Moderna series) 05/30/2020  . Flu Shot  05/23/2021  . Tetanus Vaccine  01/26/2030  . DEXA scan (bone density measurement)  Completed  . Pneumonia vaccines  Completed  . HPV Vaccine  Aged Out

## 2021-02-07 NOTE — Progress Notes (Signed)
Kristina Maldonado is a 80 y.o. female who presents for annual wellness visit and follow-up on chronic medical conditions.  He has the following concerns:   Immunizations and Health Maintenance Immunization History  Administered Date(s) Administered  . Fluad Quad(high Dose 65+) 06/17/2019  . Influenza Split 08/22/2010, 09/11/2011, 09/03/2012  . Influenza, High Dose Seasonal PF 07/08/2013, 07/14/2014, 09/21/2015, 06/28/2017  . Influenza, Seasonal, Injecte, Preservative Fre 06/17/2019  . Influenza-Unspecified 08/01/2016, 06/28/2017  . Moderna Sars-Covid-2 Vaccination 11/03/2019, 12/01/2019  . Pneumococcal Conjugate-13 07/14/2014  . Pneumococcal Polysaccharide-23 07/07/2009  . Td 08/02/1999  . Tdap 09/19/2010, 01/27/2020  . Zoster 09/24/2006  . Zoster Recombinat (Shingrix) 12/29/2016, 04/18/2017   Health Maintenance Due  Topic Date Due  . Hepatitis C Screening  Never done  . COVID-19 Vaccine (3 - Booster for Moderna series) 05/30/2020    Last colonoscopy:  3/ 19/19 cologuard 10/25/17 Last PSA: Dentist: Ophtho: Exercise:  Other doctors caring for patient include:  Advanced Directives:    Depression screen:  See questionnaire below.     Depression screen [facility] 2/9 11/28/2019 11/26/2018 11/21/2017 10/31/2016 10/26/2015  Decreased Interest 0 0 0 0 0  Down, Depressed, Hopeless 0 0 0 0 0  PHQ - 2 Score 0 0 0 0 0    Fall Screen: See Questionaire below.   Fall Risk  11/28/2019 11/26/2018 11/21/2017 10/31/2016 10/26/2015  Falls in the past year? 0 0 No No No  Number falls in past yr: - 0 - - -  Injury with Fall? - 0 - - -    ADL screen:  See questionnaire below.  Functional Status Survey:     Review of Systems  Constitutional: -, -unexpected weight change, -anorexia, -fatigue Allergy: -sneezing, -itching, -congestion Dermatology: denies changing moles, rash, lumps ENT: -runny nose, -ear pain, -sore throat,  Cardiology:  -chest pain, -palpitations, -orthopnea, Respiratory: -cough, -shortness  of breath, -dyspnea on exertion, -wheezing,  Gastroenterology: -abdominal pain, -nausea, -vomiting, -diarrhea, -constipation, -dysphagia Hematology: -bleeding or bruising problems Musculoskeletal: -arthralgias, -myalgias, -joint swelling, -back pain, - Ophthalmology: -vision changes,  Urology: -dysuria, -difficulty urinating,  -urinary frequency, -urgency, incontinence Neurology: -, -numbness, , -memory loss, -falls, -dizziness    PHYSICAL EXAM:  There were no vitals taken for this visit.  General Appearance: Alert, cooperative, no distress, appears stated age Head: Normocephalic, without obvious abnormality, atraumatic Eyes: PERRL, conjunctiva/corneas clear, EOM's intact, fundi benign Ears: Normal TM's and external ear canals Nose: Nares normal, mucosa normal, no drainage or sinus   tenderness Throat: Lips, mucosa, and tongue normal; teeth and gums normal Neck: Supple, no lymphadenopathy, thyroid:no enlargement/tenderness/nodules; no carotid bruit or JVD Lungs: Clear to auscultation bilaterally without wheezes, rales or ronchi; respirations unlabored Heart: Regular rate and rhythm, S1 and S2 normal, no murmur, rub or gallop Abdomen: Soft, non-tender, nondistended, normoactive bowel sounds, no masses, no hepatosplenomegaly Extremities: No clubbing, cyanosis or edema Pulses: 2+ and symmetric all extremities Skin: Skin color, texture, turgor normal, no rashes or lesions Lymph nodes: Cervical, supraclavicular, and axillary nodes normal Neurologic: CNII-XII intact, normal strength, sensation and gait; reflexes 2+ and symmetric throughout   Psych: Normal mood, affect, hygiene and grooming  ASSESSMENT/PLAN:    Discussed PSA screening (risks/benefits), recommended at least 30 minutes of aerobic activity at least 5 days/week; proper sunscreen use reviewed; healthy diet and alcohol recommendations (less than or equal to 2 drinks/day) reviewed; regular seatbelt use; changing batteries in  smoke detectors. Immunization recommendations discussed.  Colonoscopy recommendations reviewed.   Medicare Attestation I have personally reviewed: The patient's  medical and social history Their use of alcohol, tobacco or illicit drugs Their current medications and supplements The patient's functional ability including ADLs,fall risks, home safety risks, cognitive, and hearing and visual impairment Diet and physical activities Evidence for depression or mood disorders  The patient's weight, height, and BMI have been recorded in the chart.  I have made referrals, counseling, and provided education to the patient based on review of the above and I have provided the patient with a written personalized care plan for preventive services.     Sharlot Gowda, MD   02/07/2021

## 2021-02-07 NOTE — Progress Notes (Signed)
Kristina Maldonado is a 80 y.o. female who presents for annual wellness visit ,CPE and follow-up on chronic medical conditions.  She has no particular concerns or complaints.  She continues on her blood pressure medicatio, extra vitamin D and calcium.  Ns without any problems as well as taking her Lipitor with no aches or pains.  She keeps herself physically active taking care of her grandchildren 3 times per week.  Otherwise she is not involved in an exercise program.  She does have underlying allergies and treats them intermittently with OTC medications.  She monitors her OAB by monitoring her fluid intake.  She continues on multivitamin she is followed regularly by ophthalmology for her cataracts.  Otherwise she has no particular concerns or complaints.  Immunizations and Health Maintenance Immunization History  Administered Date(s) Administered  . Fluad Quad(high Dose 65+) 06/17/2019  . Influenza Split 08/22/2010, 09/11/2011, 09/03/2012  . Influenza, High Dose Seasonal PF 07/08/2013, 07/14/2014, 09/21/2015, 06/28/2017  . Influenza, Seasonal, Injecte, Preservative Fre 06/17/2019  . Influenza-Unspecified 08/01/2016, 06/28/2017  . Moderna Sars-Covid-2 Vaccination 11/03/2019, 12/01/2019, 08/23/2020  . Pneumococcal Conjugate-13 07/14/2014  . Pneumococcal Polysaccharide-23 07/07/2009  . Td 08/02/1999  . Tdap 09/19/2010, 01/27/2020  . Zoster 09/24/2006  . Zoster Recombinat (Shingrix) 12/29/2016, 04/18/2017   Health Maintenance Due  Topic Date Due  . Hepatitis C Screening  Never done    Last Pap smear: aged out Last mammogram: 03/11/20 Last colonoscopy: 01/08/18 cologuard 11/21/17 Last DEXA: 03/11/20 Dentist: Over one year Ophtho: Q six months Exercise: Yard work two days a week  Other doctors caring for patient include: Dr. Alben Spittle eye , Dr. Eloy End podiatry   Advanced directives: Does Patient Have a Medical Advance Directive?: Yes Type of Advance Directive: Living will  Depression screen:   See questionnaire below.  Depression screen [facility] 2/9 02/07/2021 11/28/2019 11/26/2018 11/21/2017 10/31/2016  Decreased Interest 0 0 0 0 0  Down, Depressed, Hopeless 0 0 0 0 0  PHQ - 2 Score 0 0 0 0 0    Fall Risk Screen: see questionnaire below. Fall Risk  02/07/2021 11/28/2019 11/26/2018 11/21/2017 10/31/2016  Falls in the past year? 0 0 0 No No  Number falls in past yr: 0 - 0 - -  Injury with Fall? 0 - 0 - -  Risk for fall due to : No Fall Risks - - - -  Follow up Falls evaluation completed - - - -    ADL screen:  See questionnaire below Functional Status Survey: Is the patient deaf or have difficulty hearing?: Yes Does the patient have difficulty seeing, even when wearing glasses/contacts?: No Does the patient have difficulty concentrating, remembering, or making decisions?: Yes Does the patient have difficulty walking or climbing stairs?: No Does the patient have difficulty dressing or bathing?: No Does the patient have difficulty doing errands alone such as visiting a doctor's office or shopping?: No   Review of Systems Constitutional: -, -unexpected weight change, -anorexia, -fatigue Allergy: -sneezing, -itching, -congestion Dermatology: denies changing moles, rash, lumps ENT: -runny nose, -ear pain, -sore throat,  Cardiology:  -chest pain, -palpitations, -orthopnea, Respiratory: -cough, -shortness of breath, -dyspnea on exertion, -wheezing,  Gastroenterology: -abdominal pain, -nausea, -vomiting, -diarrhea, -constipation, -dysphagia Hematology: -bleeding or bruising problems Musculoskeletal: -arthralgias, -myalgias, -joint swelling, -back pain, - Ophthalmology: -vision changes,  Urology: -dysuria, -difficulty urinating,  -urinary frequency, -urgency, incontinence Neurology: -, -numbness, , -memory loss, -falls, -dizziness    PHYSICAL EXAM:  General Appearance: Alert, cooperative, no distress, appears stated age Head: Normocephalic,  without obvious abnormality, atraumatic Eyes:  PERRL, conjunctiva/corneas clear, EOM's intact,  Ears: Normal TM's and external ear canals Nose: Nares normal, mucosa normal, no drainage or sinus tenderness Throat: Lips, mucosa, and tongue normal; teeth and gums normal Neck: Supple, no lymphadenopathy;  thyroid:  no enlargement/tenderness/nodules; no carotid bruit or JVD Lungs: Clear to auscultation bilaterally without wheezes, rales or ronchi; respirations unlabored Heart: Regular rate and rhythm, S1 and S2 normal, no murmur, rubor gallop Abdomen: Soft, non-tender, nondistended, normoactive bowel sounds,  no masses, no hepatosplenomegaly Extremities: No clubbing, cyanosis or edema Pulses: 2+ and symmetric all extremities Skin:  Skin color, texture, turgor normal, no rashes or lesions Lymph nodes: Cervical, supraclavicular, and axillary nodes normal Neurologic:  CNII-XII intact, normal strength, sensation and gait; reflexes 2+ and symmetric throughout Psych: Normal mood, affect, hygiene and grooming.  ASSESSMENT/PLAN: Routine general medical examination at a health care facility - Plan: CBC with Differential/Platelet, Comprehensive metabolic panel, Lipid panel  Encounter for long-term (current) use of medications - Plan: CBC with Differential/Platelet, Comprehensive metabolic panel, Lipid panel  Osteopenia after menopause  Primary hypertension - Plan: CBC with Differential/Platelet, Comprehensive metabolic panel  Hyperlipidemia, unspecified hyperlipidemia type - Plan: Lipid panel, atorvastatin (LIPITOR) 40 MG tablet  Allergic rhinitis due to pollen, unspecified seasonality  Cataract of both eyes, unspecified cataract type  Obesity without serious comorbidity, unspecified classification, unspecified obesity type - Plan: CBC with Differential/Platelet, Comprehensive metabolic panel, Lipid panel  OAB (overactive bladder)  Need for hepatitis C screening test - Plan: Hepatitis C antibody  Essential hypertension - Plan: amLODipine  (NORVASC) 10 MG tablet, quinapril-hydrochlorothiazide (ACCURETIC) 10-12.5 MG tablet     Discussed  at least 30 minutes of aerobic activity at least 5 days/week and weight-bearing exercise 2x/week; proper sunscreen use reviewed; healthy diet, including goals of calcium and vitamin D intake  Immunization recommendations discussed.  Colonoscopy recommendations reviewed   Medicare Attestation I have personally reviewed: The patient's medical and social history Their use of alcohol, tobacco or illicit drugs Their current medications and supplements The patient's functional ability including ADLs,fall risks, home safety risks, cognitive, and hearing and visual impairment Diet and physical activities Evidence for depression or mood disorders  The patient's weight, height, and BMI have been recorded in the chart.  I have made referrals, counseling, and provided education to the patient based on review of the above and I have provided the patient with a written personalized care plan for preventive services.     Sharlot Gowda, MD   02/07/2021

## 2021-02-08 LAB — COMPREHENSIVE METABOLIC PANEL
ALT: 9 IU/L (ref 0–32)
AST: 22 IU/L (ref 0–40)
Albumin/Globulin Ratio: 1.6 (ref 1.2–2.2)
Albumin: 4.1 g/dL (ref 3.7–4.7)
Alkaline Phosphatase: 69 IU/L (ref 44–121)
BUN/Creatinine Ratio: 16 (ref 12–28)
BUN: 15 mg/dL (ref 8–27)
Bilirubin Total: 0.5 mg/dL (ref 0.0–1.2)
CO2: 23 mmol/L (ref 20–29)
Calcium: 9.9 mg/dL (ref 8.7–10.3)
Chloride: 106 mmol/L (ref 96–106)
Creatinine, Ser: 0.95 mg/dL (ref 0.57–1.00)
Globulin, Total: 2.5 g/dL (ref 1.5–4.5)
Glucose: 89 mg/dL (ref 65–99)
Potassium: 4.2 mmol/L (ref 3.5–5.2)
Sodium: 141 mmol/L (ref 134–144)
Total Protein: 6.6 g/dL (ref 6.0–8.5)
eGFR: 62 mL/min/{1.73_m2} (ref >59)

## 2021-02-08 LAB — CBC WITH DIFFERENTIAL/PLATELET
Basophils Absolute: 0 10*3/uL (ref 0.0–0.2)
Basos: 1 %
EOS (ABSOLUTE): 0.1 10*3/uL (ref 0.0–0.4)
Eos: 2 %
Hematocrit: 39.1 % (ref 34.0–46.6)
Hemoglobin: 13.6 g/dL (ref 11.1–15.9)
Immature Grans (Abs): 0 10*3/uL (ref 0.0–0.1)
Immature Granulocytes: 0 %
Lymphocytes Absolute: 1.7 10*3/uL (ref 0.7–3.1)
Lymphs: 43 %
MCH: 32.6 pg (ref 26.6–33.0)
MCHC: 34.8 g/dL (ref 31.5–35.7)
MCV: 94 fL (ref 79–97)
Monocytes Absolute: 0.3 10*3/uL (ref 0.1–0.9)
Monocytes: 9 %
Neutrophils Absolute: 1.7 10*3/uL (ref 1.4–7.0)
Neutrophils: 45 %
Platelets: 196 10*3/uL (ref 150–450)
RBC: 4.17 x10E6/uL (ref 3.77–5.28)
RDW: 12.9 % (ref 11.7–15.4)
WBC: 3.9 10*3/uL (ref 3.4–10.8)

## 2021-02-08 LAB — LIPID PANEL
Chol/HDL Ratio: 2 ratio (ref 0.0–4.4)
Cholesterol, Total: 210 mg/dL — ABNORMAL HIGH (ref 100–199)
HDL: 105 mg/dL (ref >39)
LDL Chol Calc (NIH): 97 mg/dL (ref 0–99)
Triglycerides: 44 mg/dL (ref 0–149)
VLDL Cholesterol Cal: 8 mg/dL (ref 5–40)

## 2021-02-08 LAB — HEPATITIS C ANTIBODY: Hep C Virus Ab: <0.1 s/co ratio (ref 0.0–0.9)

## 2021-02-11 ENCOUNTER — Other Ambulatory Visit: Payer: Self-pay | Admitting: Family Medicine

## 2021-02-11 DIAGNOSIS — Z1231 Encounter for screening mammogram for malignant neoplasm of breast: Secondary | ICD-10-CM

## 2021-03-03 ENCOUNTER — Other Ambulatory Visit: Payer: Self-pay

## 2021-03-03 ENCOUNTER — Telehealth: Payer: Medicare Other | Admitting: Family Medicine

## 2021-03-24 ENCOUNTER — Ambulatory Visit (INDEPENDENT_AMBULATORY_CARE_PROVIDER_SITE_OTHER): Payer: Medicare Other

## 2021-03-24 ENCOUNTER — Other Ambulatory Visit: Payer: Self-pay

## 2021-03-24 DIAGNOSIS — Z23 Encounter for immunization: Secondary | ICD-10-CM | POA: Diagnosis not present

## 2021-03-27 ENCOUNTER — Other Ambulatory Visit: Payer: Self-pay | Admitting: Family Medicine

## 2021-03-27 DIAGNOSIS — I10 Essential (primary) hypertension: Secondary | ICD-10-CM

## 2021-04-05 ENCOUNTER — Other Ambulatory Visit: Payer: Self-pay

## 2021-04-05 ENCOUNTER — Ambulatory Visit
Admission: RE | Admit: 2021-04-05 | Discharge: 2021-04-05 | Disposition: A | Discharge status: Home / Self Care | Payer: Medicare Other | Source: Ambulatory Visit | Attending: Family Medicine | Admitting: Family Medicine

## 2021-04-05 DIAGNOSIS — Z1231 Encounter for screening mammogram for malignant neoplasm of breast: Secondary | ICD-10-CM | POA: Diagnosis not present

## 2021-04-11 DIAGNOSIS — H40023 Open angle with borderline findings, high risk, bilateral: Secondary | ICD-10-CM | POA: Diagnosis not present

## 2021-04-11 DIAGNOSIS — H25013 Cortical age-related cataract, bilateral: Secondary | ICD-10-CM | POA: Diagnosis not present

## 2021-04-11 DIAGNOSIS — H2513 Age-related nuclear cataract, bilateral: Secondary | ICD-10-CM | POA: Diagnosis not present

## 2021-04-11 DIAGNOSIS — H04123 Dry eye syndrome of bilateral lacrimal glands: Secondary | ICD-10-CM | POA: Diagnosis not present

## 2021-04-29 ENCOUNTER — Other Ambulatory Visit: Payer: Self-pay

## 2021-04-29 ENCOUNTER — Ambulatory Visit (INDEPENDENT_AMBULATORY_CARE_PROVIDER_SITE_OTHER): Payer: Medicare Other | Admitting: Medical

## 2021-04-29 VITALS — BP 124/68 | HR 69 | Temp 96.6°F | Wt 189.8 lb

## 2021-04-29 DIAGNOSIS — R21 Rash and other nonspecific skin eruption: Secondary | ICD-10-CM

## 2021-04-29 DIAGNOSIS — L249 Irritant contact dermatitis, unspecified cause: Secondary | ICD-10-CM | POA: Diagnosis not present

## 2021-04-29 MED ORDER — TRIAMCINOLONE ACETONIDE 0.1 % EX CREA: 1.0000 "application " | TOPICAL_CREAM | Freq: Two times a day (BID) | CUTANEOUS | 0 refills | Status: AC

## 2021-04-29 MED ORDER — HYDROXYZINE HCL 10 MG PO TABS
10.0000 mg | ORAL_TABLET | Freq: Two times a day (BID) | ORAL | 0 refills | Status: DC | PRN
Start: 2021-04-29 — End: 2023-09-05

## 2021-04-29 NOTE — Patient Instructions (Signed)
Rash Likely due to contact with irritant, possibly out in the yard, possibly the newer body wash Avoid potential chemicals or body wash for now Begin triamcinolone cream topically to the itchy spots on chest, arms, leg Do not use the cream on the face Begin Hydroxyzine tablet once or twice daily for itching for the next several days  If not much improved by Monday, then call back

## 2021-04-29 NOTE — Progress Notes (Signed)
Subjective:  Kristina Maldonado is a 80 y.o. female who presents for Chief Complaint  Patient presents with   rash     Rash on arms and around neck and under breasts and in pelvic area. Been going on for about a week.     Here today for itchy rash.  Rash has been present for about a week.  She notes itchiness and rash on her arms, chest, upper thighs, neck.  No blisters, no fever, no body aches or chills, no belly pain.  She denies any specific trigger.  She has been outside working in the yard and spraying pesticides and herbicides.  She also has used a new body wash recently.  No other new triggers.  Using nothing for the symptoms.  No prior similar rash.  No other aggravating or relieving factors.    No other c/o.  Past Medical History:  Diagnosis Date   Allergy    Dyslipidemia    Hyperlipidemia    Hypertension    Obesity    Osteopenia    Current Outpatient Medications on File Prior to Visit  Medication Sig Dispense Refill   amLODipine (NORVASC) 10 MG tablet Take 1 tablet (10 mg total) by mouth daily. 90 tablet 3   aspirin 81 MG tablet Take 81 mg by mouth daily.     atorvastatin (LIPITOR) 40 MG tablet Take 1 tablet (40 mg total) by mouth daily. 90 tablet 3   BIOTIN PO Take 500 mcg by mouth.     Calcium-Magnesium-Vitamin D (CALCIUM 1200+D3 PO) Take 600 mg by mouth once.     Cholecalciferol (VITAMIN D3) 3000 units TABS Take 4,000 Units by mouth daily.     Docusate Sodium 100 MG capsule Take 100 mg by mouth 2 (two) times daily.     Multiple Vitamins-Minerals (MULTIVITAMIN WITH MINERALS) tablet Take 1 tablet by mouth daily. Reported on 03/22/2016     Omega-3 1000 MG CAPS Take by mouth.     Potassium 99 MG TABS Take 99 mg by mouth once.     quinapril-hydrochlorothiazide (ACCURETIC) 10-12.5 MG tablet TAKE 1 TABLET BY MOUTH  DAILY 90 tablet 0   No current facility-administered medications on file prior to visit.    The following portions of the patient's history were reviewed and updated  as appropriate: allergies, current medications, past family history, past medical history, past social history, past surgical history and problem list.  ROS Otherwise as in subjective above  Objective: BP 124/68   Pulse 69   Temp (!) 96.6 F (35.9 C)   Wt 189 lb 12.8 oz (86.1 kg)   BMI 31.58 kg/m   General appearance: alert, no distress, well developed, well nourished, African-American female Skin: There is no well-defined distinct rash but there is some faint pinkish-brown irritated or rough skin and several patches on bilateral forearms medially, barely noticeable faint brown irritated/rash on upper chest medially, 2 small reddish somewhat papular to millimeter diameter lesions on the left and right upper thighs    Assessment: Encounter Diagnoses  Name Primary?   Rash Yes   Irritant contact dermatitis, unspecified trigger      Plan: We discussed her symptoms and concerns and likely differential.  I suspect some kind of irritant dermatitis either from chemical such as spraying herbicides, plant irritation through contact or possibly even the recent body wash that is new.  The rash is not well-defined today in some places barely noticeable.  I recommended she use a topical cream below except for  the face.  Begin hydroxyzine, caution with sedation.  Avoid potential chemical contact or triggers.  If not much improved by Monday we can add some low-dose prednisone.  If way worse or not improving at all then recheck  Dusty was seen today for rash .  Diagnoses and all orders for this visit:  Rash  Irritant contact dermatitis, unspecified trigger  Other orders -     triamcinolone cream (KENALOG) 0.1 %; Apply 1 application topically 2 (two) times daily. -     hydrOXYzine (ATARAX/VISTARIL) 10 MG tablet; Take 1 tablet (10 mg total) by mouth 2 (two) times daily as needed.   Follow up: prn

## 2021-05-13 ENCOUNTER — Ambulatory Visit: Payer: Medicare Other | Admitting: Podiatry

## 2021-05-13 ENCOUNTER — Encounter: Payer: Self-pay | Admitting: Podiatry

## 2021-05-13 ENCOUNTER — Other Ambulatory Visit: Payer: Self-pay

## 2021-05-13 DIAGNOSIS — M79674 Pain in right toe(s): Secondary | ICD-10-CM

## 2021-05-13 DIAGNOSIS — L84 Corns and callosities: Secondary | ICD-10-CM | POA: Diagnosis not present

## 2021-05-13 DIAGNOSIS — B351 Tinea unguium: Secondary | ICD-10-CM | POA: Diagnosis not present

## 2021-05-13 DIAGNOSIS — M79675 Pain in left toe(s): Secondary | ICD-10-CM

## 2021-05-15 NOTE — Progress Notes (Signed)
Subjective: Kristina Maldonado is a pleasant 80 y.o. female patient seen today painful thick toenails that are difficult to trim. Pain interferes with ambulation. Aggravating factors include wearing enclosed shoe gear. Pain is relieved with periodic professional debridement.  PCP is Ronnald Nian, MD. Last visit was: 02/07/2021.  No Known Allergies  Objective: Physical Exam  General: Kristina Maldonado is a pleasant 80 y.o. African American female, WD, WN in NAD. AAO x 3.   Vascular:  Capillary fill time to digits <3 seconds b/l lower extremities. Palpable DP pulse(s) b/l lower extremities Nonpalpable PT pulse(s) b/l lower extremities. Pedal hair absent. Lower extremity skin temperature gradient within normal limits. No pain with calf compression b/l.  Dermatological:  Pedal skin with normal turgor, texture and tone b/l lower extremities. Toenails 1-5 b/l elongated, discolored, dystrophic, thickened, crumbly with subungual debris and tenderness to dorsal palpation. Hyperkeratotic lesion(s) L 5th toe.  No erythema, no edema, no drainage, no fluctuance.  Musculoskeletal:  Normal muscle strength 5/5 to all lower extremity muscle groups bilaterally. No pain crepitus or joint limitation noted with ROM b/l. Hammertoe(s) noted to the L 5th toe and R 5th toe.  Neurological:  Protective sensation intact 5/5 intact bilaterally with 10g monofilament b/l. Vibratory sensation intact b/l.  Assessment and Plan:  1. Pain due to onychomycosis of toenails of both feet   2. Corn left 5th digit   3. Pain in left toe(s)   -No new findings. No new orders. -Patient to continue soft, supportive shoe gear daily. -Toenails 1-5 b/l were debrided in length and girth with sterile nail nippers and dremel without iatrogenic bleeding.  -Patient to report any pedal injuries to medical professional immediately. -Patient/POA to call should there be question/concern in the interim.  Return in about 3 months (around  08/13/2021).  Freddie Breech, DPM

## 2021-06-15 ENCOUNTER — Other Ambulatory Visit: Payer: Self-pay | Admitting: Family Medicine

## 2021-06-15 DIAGNOSIS — I10 Essential (primary) hypertension: Secondary | ICD-10-CM

## 2021-06-16 ENCOUNTER — Telehealth: Payer: Self-pay | Admitting: Family Medicine

## 2021-06-16 MED ORDER — LOSARTAN POTASSIUM-HCTZ 50-12.5 MG PO TABS
1.0000 | ORAL_TABLET | Freq: Every day | ORAL | 3 refills | Status: DC
Start: 2021-06-16 — End: 2022-02-13

## 2021-06-16 NOTE — Telephone Encounter (Signed)
  Fax from [facility] rx re Quinapril/hctz 10-12.5, it has been discontinued By the manufacturer. Pt has been informed that the medication is no longer available.  Please provide new prescription as appropriate

## 2021-07-19 ENCOUNTER — Other Ambulatory Visit: Payer: Self-pay

## 2021-07-19 ENCOUNTER — Ambulatory Visit (INDEPENDENT_AMBULATORY_CARE_PROVIDER_SITE_OTHER): Payer: Medicare Other | Admitting: Family Medicine

## 2021-07-19 VITALS — BP 130/80 | Temp 97.0°F | Wt 187.8 lb

## 2021-07-19 DIAGNOSIS — N3281 Overactive bladder: Secondary | ICD-10-CM | POA: Diagnosis not present

## 2021-07-19 DIAGNOSIS — B028 Zoster with other complications: Secondary | ICD-10-CM

## 2021-07-19 MED ORDER — VALACYCLOVIR HCL 1 G PO TABS: 1000.0000 mg | ORAL_TABLET | Freq: Three times a day (TID) | ORAL | 0 refills | Status: AC

## 2021-07-19 MED ORDER — MIRABEGRON ER 25 MG PO TB24: 25.0000 mg | ORAL_TABLET | Freq: Every day | ORAL | 5 refills | Status: DC

## 2021-07-19 NOTE — Patient Instructions (Signed)
Shingles ?Shingles, which is also known as herpes zoster, is an infection that causes a painful skin rash and fluid-filled blisters. It is caused by a virus. ?Shingles only develops in people who: ?Have had chickenpox. ?Have been vaccinated against chickenpox. Shingles is rare in this group. ?What are the causes? ?Shingles is caused by varicella-zoster virus. This is the same virus that causes chickenpox. After a person is exposed to the virus, it stays in the body in an inactive (dormant) state. Shingles develops if the virus is reactivated. This can happen many years after the first (initial) exposure to the virus. It is not known what causes this virus to be reactivated. ?What increases the risk? ?People who have had chickenpox or received the chickenpox vaccine are at risk for shingles. Shingles infection is more common in people who: ?Are older than 78 years of age. ?Have a weakened disease-fighting system (immune system), such as people with: ?HIV (human immunodeficiency virus). ?AIDS (acquired immunodeficiency syndrome). ?Cancer. ?Are taking medicines that weaken the immune system, such as organ transplant medicines. ?Are experiencing a lot of stress. ?What are the signs or symptoms? ?Early symptoms of this condition include itching, tingling, and pain in an area on your skin. Pain may be described as burning, stabbing, or throbbing. ?A few days or weeks after early symptoms start, a painful red rash appears. The rash is usually on one side of the body and has a band-like or belt-like pattern. The rash eventually turns into fluid-filled blisters that break open, change into scabs, and dry up in about 2-3 weeks. ?At any time during the infection, you may also develop: ?A fever. ?Chills. ?A headache. ?Nausea. ?How is this diagnosed? ?This condition is diagnosed with a skin exam. Skin or fluid samples (a culture) may be taken from the blisters before a diagnosis is made. ?How is this treated? ?The rash may last  for several weeks. There is not a specific cure for this condition. Your health care provider may prescribe medicines to help you manage pain, recover more quickly, and avoid long-term problems. Medicines may include: ?Antiviral medicines. ?Anti-inflammatory medicines. ?Pain medicines. ?Anti-itching medicines (antihistamines). ?If the area involved is on your face, you may be referred to a specialist, such as an eye doctor (ophthalmologist) or an ear, nose, and throat (ENT) doctor (otorhinolaryngologist) to help you avoid eye problems, chronic pain, or disability. ?Follow these instructions at home: ?Medicines ?Take over-the-counter and prescription medicines only as told by your health care provider. ?Apply an anti-itch cream or numbing cream to the affected area as told by your health care provider. ?Relieving itching and discomfort ? ?Apply cold, wet cloths (cold compresses) to the area of the rash or blisters as told by your health care provider. ?Cool baths can be soothing. Try adding baking soda or dry oatmeal to the water to reduce itching. Do not bathe in hot water. ?Use calamine lotion as recommended by your health care provider. This is an over-the-counter lotion that helps to relieve itchiness. ?Blister and rash care ?Keep your rash covered with a loose bandage (dressing). Wear loose-fitting clothing to help ease the pain of material rubbing against the rash. ?Wash your hands with soap and water for at least 20 seconds before and after you change your dressing. If soap and water are not available, use hand sanitizer. ?Change your dressing as told by your health care provider. ?Keep your rash and blisters clean by washing the area with mild soap and cool water as told by your health   care provider. ?Check your rash every day for signs of infection. Check for: ?More redness, swelling, or pain. ?Fluid or blood. ?Warmth. ?Pus or a bad smell. ?Do not scratch your rash or pick at your blisters. To help avoid  scratching: ?Keep your fingernails clean and cut short. ?Wear gloves or mittens while you sleep, if scratching is a problem. ?General instructions ?Rest as told by your health care provider. ?Wash your hands often with soap and water for at least 20 seconds. If soap and water are not available, use hand sanitizer. Doing this lowers your chance of getting a bacterial skin infection. ?Before your blisters change into scabs, your shingles infection can cause chickenpox in people who have never had it or have never been vaccinated against it. To prevent this from happening, avoid contact with other people, especially: ?Babies. ?Pregnant women. ?Children who have eczema. ?Older people who have transplants. ?People who have chronic illnesses, such as cancer or AIDS. ?Keep all follow-up visits. This is important. ?How is this prevented? ?Getting vaccinated is the best way to prevent shingles and protect against shingles complications. If you have not been vaccinated, talk with your health care provider about getting the vaccine. ?Where to find more information ?Centers for Disease Control and Prevention: www.cdc.gov ?Contact a health care provider if: ?Your pain is not relieved with prescribed medicines. ?Your pain does not get better after the rash heals. ?You have any of these signs of infection: ?More redness, swelling, or pain around the rash. ?Fluid or blood coming from the rash. ?Warmth coming from your rash. ?Pus or a bad smell coming from the rash. ?A fever. ?Get help right away if: ?The rash is on your face or nose. ?You have facial pain, pain around your eye area, or loss of feeling on one side of your face. ?You have difficulty seeing. ?You have ear pain or have ringing in your ear. ?You have a loss of taste. ?Your condition gets worse. ?Summary ?Shingles, also known as herpes zoster, is an infection that causes a painful skin rash and fluid-filled blisters. ?This condition is diagnosed with a skin exam. Skin or  fluid samples (a culture) may be taken from the blisters. ?Keep your rash covered with a loose bandage (dressing). Wear loose-fitting clothing to help ease the pain of material rubbing against the rash. ?Before your blisters change into scabs, your shingles infection can cause chickenpox in people who have never had it or have never been vaccinated against it. ?This information is not intended to replace advice given to you by your health care provider. Make sure you discuss any questions you have with your health care provider. ?Document Revised: 10/04/2020 Document Reviewed: 10/04/2020 ?Elsevier Patient Education ? 2022 [employer]vier Inc[employer] ? ?

## 2021-07-19 NOTE — Progress Notes (Signed)
   Subjective:    Patient ID: Kristina Maldonado, female    DOB: [birth year], 80 y.o.   MRN: [id number]  HPI She states that she woke up Sunday morning and noted the rash on the left upper chest area that is pruritic in nature but does not hurt.  No fever, chills, malaise.  She also notes that she is now started to have difficulty with OAB symptoms of urgency and frequency.  She was given Myrbetriq in the past which apparently did help.  It was a 7 days sample.   Review of Systems     Objective:   Physical Exam Exam of the left upper chest does show multiple maculopapular type lesions but not truly vesicular in the C5 distribution but I also note several lesions in the right C5 posteriorly. Urine dipstick showed red cells however microscopic was negative.      Assessment & Plan:  Herpes zoster with complication - Plan: valACYclovir (VALTREX) 1000 MG tablet  OAB (overactive bladder) - Plan: mirabegron ER (MYRBETRIQ) 25 MG TB24 tablet I explained that her symptoms are suggestive of shingles but not classic due to the fact that it is past midline on the right.  Explained that if her symptoms get worse, she should return for further evaluation to evaluate for possible other causes. I then discussed the overactive bladder symptoms with her.  I will call in Myrbetriq and see what the cost is.  Sample was given as well.

## 2021-08-03 ENCOUNTER — Other Ambulatory Visit (INDEPENDENT_AMBULATORY_CARE_PROVIDER_SITE_OTHER): Payer: Medicare Other

## 2021-08-03 ENCOUNTER — Other Ambulatory Visit: Payer: Self-pay

## 2021-08-03 DIAGNOSIS — Z23 Encounter for immunization: Secondary | ICD-10-CM | POA: Diagnosis not present

## 2021-08-17 ENCOUNTER — Ambulatory Visit: Payer: Medicare Other | Admitting: Podiatry

## 2021-08-19 ENCOUNTER — Encounter: Payer: Self-pay | Admitting: Podiatry

## 2021-08-19 ENCOUNTER — Other Ambulatory Visit: Payer: Self-pay

## 2021-08-19 ENCOUNTER — Ambulatory Visit: Payer: Medicare Other | Admitting: Podiatry

## 2021-08-19 DIAGNOSIS — M2042 Other hammer toe(s) (acquired), left foot: Secondary | ICD-10-CM

## 2021-08-19 DIAGNOSIS — L84 Corns and callosities: Secondary | ICD-10-CM

## 2021-08-19 DIAGNOSIS — M79674 Pain in right toe(s): Secondary | ICD-10-CM

## 2021-08-19 DIAGNOSIS — B351 Tinea unguium: Secondary | ICD-10-CM | POA: Diagnosis not present

## 2021-08-19 DIAGNOSIS — M79675 Pain in left toe(s): Secondary | ICD-10-CM | POA: Diagnosis not present

## 2021-08-19 NOTE — Progress Notes (Signed)
This patient returns to the office for evaluation and treatment of long thick painful nails .  This patient is unable to trim her own nails since the patient cannot reach her feet.  Patient says the nails are painful walking and wearing his shoes.  She also says her corn fifth toe left foot is painful.   He returns for preventive foot care services.  General Appearance  Alert, conversant and in no acute stress.  Vascular  Dorsalis pedis and posterior tibial  pulses are  weakly palpable  bilaterally.  Capillary return is within normal limits  bilaterally. Temperature is within normal limits  bilaterally.  Neurologic  Senn-Weinstein monofilament wire test within normal limits  bilaterally. Muscle power within normal limits bilaterally.  Nails Thick disfigured discolored nails with subungual debris  from hallux to fifth toes bilaterally. No evidence of bacterial infection or drainage bilaterally.  Orthopedic  No limitations of motion  feet .  No crepitus or effusions noted.  No bony pathology or digital deformities noted. Hammer toe fifth toe  B/L.  Skin  normotropic skin with no porokeratosis noted bilaterally.  No signs of infections or ulcers noted.   [place] durum fifth toe left foot.  Onychomycosis  Pain in toes right foot  Pain in toes left foot  [place] durum fifth toe left foot.  Debridement  of nails  1-5  B/L with a nail nipper.  Nails were then filed using a dremel tool with no incidents. Debride HD 5th to left foot.   RTC 3 months   Helane Gunther DPM

## 2021-10-11 ENCOUNTER — Other Ambulatory Visit: Payer: Self-pay

## 2021-10-11 ENCOUNTER — Ambulatory Visit (INDEPENDENT_AMBULATORY_CARE_PROVIDER_SITE_OTHER): Payer: Medicare Other

## 2021-10-11 DIAGNOSIS — Z23 Encounter for immunization: Secondary | ICD-10-CM

## 2021-11-01 ENCOUNTER — Ambulatory Visit (INDEPENDENT_AMBULATORY_CARE_PROVIDER_SITE_OTHER): Payer: Medicare Other | Admitting: Family Medicine

## 2021-11-01 ENCOUNTER — Encounter: Payer: Self-pay | Admitting: Family Medicine

## 2021-11-01 ENCOUNTER — Other Ambulatory Visit: Payer: Self-pay

## 2021-11-01 DIAGNOSIS — H40023 Open angle with borderline findings, high risk, bilateral: Secondary | ICD-10-CM | POA: Diagnosis not present

## 2021-11-01 DIAGNOSIS — R7309 Other abnormal glucose: Secondary | ICD-10-CM | POA: Diagnosis not present

## 2021-11-01 DIAGNOSIS — N3281 Overactive bladder: Secondary | ICD-10-CM | POA: Diagnosis not present

## 2021-11-01 DIAGNOSIS — H524 Presbyopia: Secondary | ICD-10-CM | POA: Diagnosis not present

## 2021-11-01 DIAGNOSIS — H25813 Combined forms of age-related cataract, bilateral: Secondary | ICD-10-CM | POA: Diagnosis not present

## 2021-11-01 DIAGNOSIS — H04123 Dry eye syndrome of bilateral lacrimal glands: Secondary | ICD-10-CM | POA: Diagnosis not present

## 2021-11-01 MED ORDER — MIRABEGRON ER 25 MG PO TB24: 25.0000 mg | ORAL_TABLET | Freq: Every day | ORAL | 5 refills | Status: DC

## 2021-11-01 NOTE — Progress Notes (Signed)
° °  Subjective:    Patient ID: Kristina Maldonado, female    DOB: 1941-08-13, 81 y.o.   MRN: [id number]  HPI She is here for consult concerning overactive bladder.  In September a prescription was written for [facility] but she did not pick it up.  She was unaware that it was called in.   Review of Systems     Objective:   Physical Exam Alert and in no distress otherwise not examined       Assessment & Plan:  OAB (overactive bladder) - Plan: mirabegron ER (MYRBETRIQ) 25 MG TB24 tablet Discussed the fact that the medicine might be cost prohibitive and if so there are other medications we can use it potentially have unacceptable side effects.  She will let me know if there is a problem.

## 2021-11-30 ENCOUNTER — Encounter: Payer: Self-pay | Admitting: Podiatry

## 2021-11-30 ENCOUNTER — Other Ambulatory Visit: Payer: Self-pay

## 2021-11-30 ENCOUNTER — Ambulatory Visit: Payer: Medicare Other | Admitting: Podiatry

## 2021-11-30 DIAGNOSIS — M79675 Pain in left toe(s): Secondary | ICD-10-CM

## 2021-11-30 DIAGNOSIS — B351 Tinea unguium: Secondary | ICD-10-CM

## 2021-11-30 DIAGNOSIS — M79674 Pain in right toe(s): Secondary | ICD-10-CM | POA: Diagnosis not present

## 2021-11-30 DIAGNOSIS — L84 Corns and callosities: Secondary | ICD-10-CM

## 2021-12-06 NOTE — Progress Notes (Signed)
°  Subjective:  Patient ID: Kristina Maldonado, female    DOB: 1941-10-14,  MRN: [id number]  LADEJA PELHAM presents to clinic today for corn(s) left 5th digit and painful thick toenails that are difficult to trim. Painful toenails interfere with ambulation. Aggravating factors include wearing enclosed shoe gear. Pain is relieved with periodic professional debridement. Painful corns are aggravated when weightbearing when wearing enclosed shoe gear. Pain is relieved with periodic professional debridement.  New problem(s): None.   PCP is Ronnald Nian, MD , and last visit was November 01, 2021.  No Known Allergies  Review of Systems: Negative except as noted in the HPI. Objective:   Constitutional Kristina Maldonado is a pleasant 81 y.o. African American female, WD, WN in NAD. AAO x 3.   Vascular CFT <3 seconds b/l LE. Palpable DP pulse(s) b/l LE. Diminished PT pulse(s) b/l LE. No pain with calf compression b/l. Lower extremity skin temperature gradient within normal limits. No edema noted b/l LE. No cyanosis or clubbing noted b/l LE.  Neurologic Normal speech. Oriented to person, place, and time. Protective sensation intact 5/5 intact bilaterally with 10g monofilament b/l. Vibratory sensation intact b/l.  Dermatologic Pedal integument with normal turgor, texture and tone b/l LE. No open wounds b/l. No interdigital macerations b/l. Toenails 1-5 b/l elongated, thickened, discolored with subungual debris. +Tenderness with dorsal palpation of nailplates. Hyperkeratotic lesion(s) noted L 5th toe.  Orthopedic: Normal muscle strength 5/5 to all lower extremity muscle groups bilaterally. Hammertoe(s) noted to the bilateral 5th toes.. No pain, crepitus or joint limitation noted with ROM b/l LE.  Patient ambulates independently without assistive aids.   Radiographs: None  Last A1c: No flowsheet data found.   Assessment:   1. Pain due to onychomycosis of toenails of both feet   2. Corn left 5th digit   3.  Pain in left toe(s)    Plan:  Patient was evaluated and treated and all questions answered. Consent given for treatment as described below: -Medicare ABN signed for this year. Patient consents for services of paring of corn/callus  today. Copy has been placed in patient's chart. -Mycotic toenails 1-5 bilaterally were debrided in length and girth with sterile nail nippers and dremel without incident. -Corn(s) L 5th toe pared utilizing sterile scalpel blade without complication or incident. Total number debrided=1. -Patient/POA to call should there be question/concern in the interim.  Return in about 3 months (around 02/27/2022).  Freddie Breech, DPM

## 2022-02-12 NOTE — Progress Notes (Signed)
Kristina Maldonado is a 81 y.o. female who presents for annual wellness visit ,CPE and follow-up on chronic medical conditions.  She does complain of cold intolerance especially of her hands but no fatigue skin or hair changes.  She has been under a lot of stress dealing with her brother that has lung cancer that she is trying to be supportive.  She does have a history of osteopenia as well as hypertension and hyperlipidemia.  She is on appropriate medications for that.  Her allergies seem to be under good control.  She sees her ophthalmologist regularly for evaluation of cataracts but no surgery presently.  She states that Myrbetriq is working well for her OAB.  She is not very active and only getting twice per week any, physical activity. ? ?Immunizations and Health Maintenance ?Immunization History  ?Administered Date(s) Administered  ? Fluad Quad(high Dose 65+) 06/17/2019, 08/03/2021  ? Influenza Split 08/22/2010, 09/11/2011, 09/03/2012  ? Influenza, High Dose Seasonal PF 07/08/2013, 07/14/2014, 09/21/2015, 06/28/2017  ? Influenza, Seasonal, Injecte, Preservative Fre 06/17/2019  ? Influenza-Unspecified 08/01/2016, 06/28/2017  ? Moderna Covid-19 Vaccine Bivalent Booster 16yrs & up 10/11/2021  ? Moderna SARS-COV2 Booster Vaccination 03/24/2021  ? Moderna Sars-Covid-2 Vaccination 11/03/2019, 12/01/2019, 08/23/2020  ? Pneumococcal Conjugate-13 07/14/2014  ? Pneumococcal Polysaccharide-23 07/07/2009  ? Td 08/02/1999  ? Tdap 09/19/2010, 01/27/2020  ? Zoster Recombinat (Shingrix) 12/29/2016, 04/18/2017  ? Zoster, Live 09/24/2006  ? ?There are no preventive care reminders to display for this patient. ? ?Last Pap smear: aged out  ?Last mammogram: 04/05/21 ?Last colonoscopy: 01/08/18 cologuard  ?Last DEXA: 03/11/20 ?Dentist: over two years  ?Ophtho: Q year ?Exercise: 2 days a week in the yard ? ?Other doctors caring for patient include: Dr. Eloy End podiatry  ?           Dr. Alben Spittle ophthalmology ? ?Advanced directives: ?Does  Patient Have a Medical Advance Directive?: Yes ?Type of Advance Directive: Living will ?Does patient want to make changes to medical advance directive?: No - Patient declined ? ?Depression screen:  See questionnaire below.  ? ?  02/13/2022  ?  9:31 AM 02/07/2021  ?  8:33 AM 11/28/2019  ?  8:32 AM 11/26/2018  ?  8:40 AM 11/21/2017  ? 10:11 AM  ?Depression screen PHQ 2/9  ?Decreased Interest 0 0 0 0 0  ?Down, Depressed, Hopeless 0 0 0 0 0  ?PHQ - 2 Score 0 0 0 0 0  ? ? ?Fall Risk Screen: see questionnaire below. ? ?  02/13/2022  ?  9:30 AM 02/07/2021  ?  8:33 AM 11/28/2019  ?  8:32 AM 11/26/2018  ?  8:40 AM 11/21/2017  ? 10:11 AM  ?Fall Risk   ?Falls in the past year? 0 0 0 0 No  ?Number falls in past yr: 0 0  0   ?Injury with Fall? 0 0  0   ?Risk for fall due to : No Fall Risks No Fall Risks     ?Follow up Falls evaluation completed Falls evaluation completed     ? ? ?ADL screen:  See questionnaire below ?Functional Status Survey: ?Is the patient deaf or have difficulty hearing?: No ?Does the patient have difficulty seeing, even when wearing glasses/contacts?: No ?Does the patient have difficulty concentrating, remembering, or making decisions?: Yes (concentracting) ?Does the patient have difficulty walking or climbing stairs?: No ?Does the patient have difficulty dressing or bathing?: No ?Does the patient have difficulty doing errands alone such as visiting a doctor's office or shopping?:  No ? ? ?Review of Systems ?Constitutional: -, -unexpected weight change, -anorexia, -fatigue ?Allergy: -sneezing, -itching, -congestion ?Dermatology: denies changing moles, rash, lumps ?ENT: -runny nose, -ear pain, -sore throat,  ?Cardiology:  -chest pain, -palpitations, -orthopnea, ?Respiratory: -cough, -shortness of breath, -dyspnea on exertion, -wheezing,  ?Gastroenterology: -abdominal pain, -nausea, -vomiting, -diarrhea, -constipation, -dysphagia ?Hematology: -bleeding or bruising problems ?Musculoskeletal: -arthralgias, -myalgias, -joint  swelling, -back pain, - ?Ophthalmology: -vision changes,  ?Urology: -dysuria, -difficulty urinating,  -urinary frequency, -urgency, incontinence ?Neurology: -, -numbness, , -memory loss, -falls, -dizziness ? ? ? ?PHYSICAL EXAM: ? ?General Appearance: Alert, cooperative, no distress, appears stated age ?Head: Normocephalic, without obvious abnormality, atraumatic ?Eyes: PERRL, conjunctiva/corneas clear, EOM's intact,  ?Ears: Normal TM's and external ear canals ?Nose: Nares normal, mucosa normal, no drainage or sinus tenderness ?Throat: Lips, mucosa, and tongue normal; teeth and gums normal ?Neck: Supple, no lymphadenopathy;  thyroid:  no enlargement/tenderness/nodules; no carotid bruit or JVD ?Lungs: Clear to auscultation bilaterally without wheezes, rales or ronchi; respirations unlabored ?Heart: Regular rate and rhythm, S1 and S2 normal, no murmur, rubor gallop ?Abdomen: Soft, non-tender, nondistended, normoactive bowel sounds,  ?no masses, no hepatosplenomegaly ?Extremities: No clubbing, cyanosis or edema ?Pulses: 2+ and symmetric all extremities ?Skin:  Skin color, texture, turgor normal, no rashes or lesions ?Lymph nodes: Cervical, supraclavicular, and axillary nodes normal ?Neurologic:  CNII-XII intact, normal strength, sensation and gait; reflexes 2+ and symmetric throughout ?Psych: Normal mood, affect, hygiene and grooming. ? ?ASSESSMENT/PLAN: ?Routine general medical examination at a health care facility ? ?Primary hypertension - Plan: CBC with Differential/Platelet, Comprehensive metabolic panel, amLODipine (NORVASC) 10 MG tablet, losartan-hydrochlorothiazide (HYZAAR) 50-12.5 MG tablet ? ?Allergic rhinitis due to pollen, unspecified seasonality ? ?Osteopenia after menopause - Plan: CBC with Differential/Platelet, Comprehensive metabolic panel, DG Bone Density ? ?OAB (overactive bladder) - Plan: mirabegron ER (MYRBETRIQ) 25 MG TB24 tablet ? ?Cataract of both eyes, unspecified cataract type ? ?Encounter for  long-term (current) use of medications - Plan: CBC with Differential/Platelet, Comprehensive metabolic panel, Lipid panel ? ?Hyperlipidemia, unspecified hyperlipidemia type - Plan: Lipid panel ? ?Obesity without serious comorbidity, unspecified classification, unspecified obesity type ? ?Essential hypertension - Plan: amLODipine (NORVASC) 10 MG tablet ? ?Encounter for vitamin deficiency screening - Plan: VITAMIN D 25 Hydroxy (Vit-D Deficiency, Fractures) ? ?Cold intolerance - Plan: TSH ?Follow-up after blood work come back concerning vitamin D and bone density scanning. ? ? ?Discussed  at least 30 minutes of aerobic activity at least 5 days/week and weight-bearing exercise 2x/week;  healthy diet, including goals of calcium and vitamin D intake and alcohol recommendations (less than or equal to 1 drink/day) Immunization recommendations discussed.  Colonoscopy recommendations reviewed ? ? ?Medicare Attestation ?I have personally reviewed: ?The patient's medical and social history ?Their use of alcohol, tobacco or illicit drugs ?Their current medications and supplements ?The patient's functional ability including ADLs,fall risks, home safety risks, cognitive, and hearing and visual impairment ?Diet and physical activities ?Evidence for depression or mood disorders ? ?The patient's weight, height, and BMI have been recorded in the chart.  I have made referrals, counseling, and provided education to the patient based on review of the above and I have provided the patient with a written personalized care plan for preventive services.   ? ? ?Sharlot Gowda, MD   02/13/2022  ?

## 2022-02-13 ENCOUNTER — Ambulatory Visit (INDEPENDENT_AMBULATORY_CARE_PROVIDER_SITE_OTHER): Payer: Medicare Other | Admitting: Family Medicine

## 2022-02-13 ENCOUNTER — Encounter: Payer: Self-pay | Admitting: Family Medicine

## 2022-02-13 VITALS — BP 134/82 | HR 72 | Temp 96.8°F | Ht 64.25 in | Wt 185.8 lb

## 2022-02-13 DIAGNOSIS — E785 Hyperlipidemia, unspecified: Secondary | ICD-10-CM

## 2022-02-13 DIAGNOSIS — H269 Unspecified cataract: Secondary | ICD-10-CM | POA: Diagnosis not present

## 2022-02-13 DIAGNOSIS — M8580 Other specified disorders of bone density and structure, unspecified site: Secondary | ICD-10-CM | POA: Diagnosis not present

## 2022-02-13 DIAGNOSIS — Z1321 Encounter for screening for nutritional disorder: Secondary | ICD-10-CM | POA: Diagnosis not present

## 2022-02-13 DIAGNOSIS — E669 Obesity, unspecified: Secondary | ICD-10-CM

## 2022-02-13 DIAGNOSIS — N3281 Overactive bladder: Secondary | ICD-10-CM

## 2022-02-13 DIAGNOSIS — J301 Allergic rhinitis due to pollen: Secondary | ICD-10-CM | POA: Diagnosis not present

## 2022-02-13 DIAGNOSIS — Z780 Asymptomatic menopausal state: Secondary | ICD-10-CM | POA: Diagnosis not present

## 2022-02-13 DIAGNOSIS — L84 Corns and callosities: Secondary | ICD-10-CM

## 2022-02-13 DIAGNOSIS — Z0000 Encounter for general adult medical examination without abnormal findings: Secondary | ICD-10-CM

## 2022-02-13 DIAGNOSIS — Z79899 Other long term (current) drug therapy: Secondary | ICD-10-CM

## 2022-02-13 DIAGNOSIS — I10 Essential (primary) hypertension: Secondary | ICD-10-CM | POA: Diagnosis not present

## 2022-02-13 DIAGNOSIS — R6889 Other general symptoms and signs: Secondary | ICD-10-CM | POA: Diagnosis not present

## 2022-02-13 MED ORDER — AMLODIPINE BESYLATE 10 MG PO TABS: 10.0000 mg | ORAL_TABLET | Freq: Every day | ORAL | 3 refills | Status: DC

## 2022-02-13 MED ORDER — MIRABEGRON ER 25 MG PO TB24: 25.0000 mg | ORAL_TABLET | Freq: Every day | ORAL | 3 refills | Status: DC

## 2022-02-13 MED ORDER — LOSARTAN POTASSIUM-HCTZ 50-12.5 MG PO TABS: 1.0000 | ORAL_TABLET | Freq: Every day | ORAL | 3 refills | Status: DC

## 2022-02-14 ENCOUNTER — Telehealth: Payer: Self-pay | Admitting: Family Medicine

## 2022-02-14 NOTE — Telephone Encounter (Signed)
Please call pt with lab results.  She is not able to get into her MyChart ?

## 2022-02-16 ENCOUNTER — Telehealth: Payer: Self-pay

## 2022-02-16 NOTE — Telephone Encounter (Signed)
Lab results given to pt.

## 2022-02-17 LAB — CBC WITH DIFFERENTIAL/PLATELET
Basophils Absolute: 0 10*3/uL (ref 0.0–0.2)
Basos: 1 %
EOS (ABSOLUTE): 0.1 10*3/uL (ref 0.0–0.4)
Eos: 1 %
Hematocrit: 43.3 % (ref 34.0–46.6)
Hemoglobin: 14.5 g/dL (ref 11.1–15.9)
Immature Grans (Abs): 0 10*3/uL (ref 0.0–0.1)
Immature Granulocytes: 0 %
Lymphocytes Absolute: 1.9 10*3/uL (ref 0.7–3.1)
Lymphs: 44 %
MCH: 31.9 pg (ref 26.6–33.0)
MCHC: 33.5 g/dL (ref 31.5–35.7)
MCV: 95 fL (ref 79–97)
Monocytes Absolute: 0.3 10*3/uL (ref 0.1–0.9)
Monocytes: 7 %
Neutrophils Absolute: 2 10*3/uL (ref 1.4–7.0)
Neutrophils: 47 %
Platelets: 196 10*3/uL (ref 150–450)
RBC: 4.54 x10E6/uL (ref 3.77–5.28)
RDW: 12.7 % (ref 11.7–15.4)
WBC: 4.3 10*3/uL (ref 3.4–10.8)

## 2022-02-17 LAB — COMPREHENSIVE METABOLIC PANEL
ALT: 8 IU/L (ref 0–32)
AST: 22 IU/L (ref 0–40)
Albumin/Globulin Ratio: 1.8 (ref 1.2–2.2)
Albumin: 4.4 g/dL (ref 3.7–4.7)
Alkaline Phosphatase: 74 IU/L (ref 44–121)
BUN/Creatinine Ratio: 12 (ref 12–28)
BUN: 11 mg/dL (ref 8–27)
Bilirubin Total: 0.5 mg/dL (ref 0.0–1.2)
CO2: 19 mmol/L — ABNORMAL LOW (ref 20–29)
Calcium: 10.3 mg/dL (ref 8.7–10.3)
Chloride: 105 mmol/L (ref 96–106)
Creatinine, Ser: 0.94 mg/dL (ref 0.57–1.00)
Globulin, Total: 2.5 g/dL (ref 1.5–4.5)
Glucose: 92 mg/dL (ref 70–99)
Potassium: 4.4 mmol/L (ref 3.5–5.2)
Sodium: 141 mmol/L (ref 134–144)
Total Protein: 6.9 g/dL (ref 6.0–8.5)
eGFR: 62 mL/min/{1.73_m2} (ref >59)

## 2022-02-17 LAB — TSH: TSH: 1.3 u[IU]/mL (ref 0.450–4.500)

## 2022-02-17 LAB — LIPID PANEL
Chol/HDL Ratio: 2.1 ratio (ref 0.0–4.4)
Cholesterol, Total: 219 mg/dL — ABNORMAL HIGH (ref 100–199)
HDL: 105 mg/dL (ref >39)
LDL Chol Calc (NIH): 101 mg/dL — ABNORMAL HIGH (ref 0–99)
Triglycerides: 76 mg/dL (ref 0–149)
VLDL Cholesterol Cal: 13 mg/dL (ref 5–40)

## 2022-02-17 LAB — VITAMIN D 25 HYDROXY (VIT D DEFICIENCY, FRACTURES): Vit D, 25-Hydroxy: 34.9 ng/mL (ref 30.0–100.0)

## 2022-02-25 ENCOUNTER — Other Ambulatory Visit: Payer: Self-pay | Admitting: Family Medicine

## 2022-02-25 DIAGNOSIS — E785 Hyperlipidemia, unspecified: Secondary | ICD-10-CM

## 2022-02-25 DIAGNOSIS — I10 Essential (primary) hypertension: Secondary | ICD-10-CM

## 2022-02-28 ENCOUNTER — Encounter: Payer: Self-pay | Admitting: Podiatry

## 2022-02-28 ENCOUNTER — Ambulatory Visit: Payer: Medicare Other | Admitting: Podiatry

## 2022-02-28 DIAGNOSIS — M79675 Pain in left toe(s): Secondary | ICD-10-CM | POA: Diagnosis not present

## 2022-02-28 DIAGNOSIS — B351 Tinea unguium: Secondary | ICD-10-CM | POA: Diagnosis not present

## 2022-02-28 DIAGNOSIS — M79674 Pain in right toe(s): Secondary | ICD-10-CM | POA: Diagnosis not present

## 2022-02-28 DIAGNOSIS — L84 Corns and callosities: Secondary | ICD-10-CM

## 2022-03-06 ENCOUNTER — Other Ambulatory Visit: Payer: Self-pay | Admitting: Family Medicine

## 2022-03-06 DIAGNOSIS — Z1231 Encounter for screening mammogram for malignant neoplasm of breast: Secondary | ICD-10-CM

## 2022-03-08 NOTE — Progress Notes (Signed)
?  Subjective:  ?Patient ID: Kristina Maldonado, female    DOB: July 27, 1941,  MRN: [id number] ? ?Kristina Maldonado presents to clinic today for corn(s) L 5th toe which interfere(s) with ambulation. Aggravating factors include wearing enclosed shoe gear. Pain is relieved with periodic professional debridement. ? ?Patient states she lost her brother since her last visit. ? ?New problem(s): None.  ? ?PCP is Ronnald Nian, MD , and last visit was February 13, 2022. ? ?No Known Allergies ? ?Review of Systems: Negative except as noted in the HPI. ? ?Objective: No changes noted in today's physical examination. ?Constitutional Kristina Maldonado is a pleasant 81 y.o. African American female, WD, WN in NAD. AAO x 3.   ?Vascular CFT <3 seconds b/l LE. Palpable DP pulse(s) b/l LE. Diminished PT pulse(s) b/l LE. No pain with calf compression b/l. Lower extremity skin temperature gradient within normal limits. No edema noted b/l LE. No cyanosis or clubbing noted b/l LE.  ?Neurologic Normal speech. Oriented to person, place, and time. Protective sensation intact 5/5 intact bilaterally with 10g monofilament b/l. Vibratory sensation intact b/l.  ?Dermatologic Pedal integument with normal turgor, texture and tone b/l LE. No open wounds b/l. No interdigital macerations b/l. Toenails 1-5 b/l elongated, thickened, discolored with subungual debris. +Tenderness with dorsal palpation of nailplates. Hyperkeratotic lesion(s) noted L 5th toe.  ?Orthopedic: Normal muscle strength 5/5 to all lower extremity muscle groups bilaterally. Hammertoe(s) noted to the bilateral 5th toes.. No pain, crepitus or joint limitation noted with ROM b/l LE.  Patient ambulates independently without assistive aids.  ? ?Radiographs: None ? ?Assessment/Plan: ?1. Pain due to onychomycosis of toenails of both feet   ?2. Corn left 5th digit   ?  ?-Patient was evaluated and treated. All patient's and/or POA's questions/concerns answered on today's visit. ?-Medicare ABN signed.  Patient consents for services of paring of corn(s)/callus(es)/porokeratos(es) today. Copy in patient chart. ?-Patient to continue soft, supportive shoe gear daily. ?-Toenails 1-5 b/l were debrided in length and girth with sterile nail nippers and dremel without iatrogenic bleeding.  ?-Corn(s) L 5th toe pared utilizing sterile scalpel blade without complication or incident. Total number debrided=1. ?-Patient/POA to call should there be question/concern in the interim.  ? ?Return in about 3 months (around 05/31/2022). ? ?Kristina Maldonado, DPM  ?

## 2022-04-06 ENCOUNTER — Ambulatory Visit
Admission: RE | Admit: 2022-04-06 | Discharge: 2022-04-06 | Disposition: A | Discharge status: Home / Self Care | Payer: Medicare Other | Source: Ambulatory Visit | Attending: Family Medicine | Admitting: Family Medicine

## 2022-04-06 DIAGNOSIS — Z1231 Encounter for screening mammogram for malignant neoplasm of breast: Secondary | ICD-10-CM

## 2022-05-27 ENCOUNTER — Other Ambulatory Visit: Payer: Self-pay | Admitting: Family Medicine

## 2022-05-27 DIAGNOSIS — E785 Hyperlipidemia, unspecified: Secondary | ICD-10-CM

## 2022-05-27 DIAGNOSIS — I10 Essential (primary) hypertension: Secondary | ICD-10-CM

## 2022-05-31 ENCOUNTER — Encounter: Payer: Self-pay | Admitting: Podiatry

## 2022-05-31 ENCOUNTER — Ambulatory Visit: Payer: Medicare Other | Admitting: Podiatry

## 2022-05-31 DIAGNOSIS — M79674 Pain in right toe(s): Secondary | ICD-10-CM

## 2022-05-31 DIAGNOSIS — L84 Corns and callosities: Secondary | ICD-10-CM

## 2022-05-31 DIAGNOSIS — B351 Tinea unguium: Secondary | ICD-10-CM

## 2022-05-31 DIAGNOSIS — M79675 Pain in left toe(s): Secondary | ICD-10-CM

## 2022-06-01 ENCOUNTER — Ambulatory Visit (INDEPENDENT_AMBULATORY_CARE_PROVIDER_SITE_OTHER): Payer: Medicare Other | Admitting: Family Medicine

## 2022-06-01 ENCOUNTER — Encounter: Payer: Self-pay | Admitting: Family Medicine

## 2022-06-01 VITALS — BP 110/70 | HR 73 | Temp 96.8°F | Wt 189.6 lb

## 2022-06-01 DIAGNOSIS — R609 Edema, unspecified: Secondary | ICD-10-CM

## 2022-06-01 DIAGNOSIS — M791 Myalgia, unspecified site: Secondary | ICD-10-CM | POA: Diagnosis not present

## 2022-06-01 DIAGNOSIS — R519 Headache, unspecified: Secondary | ICD-10-CM | POA: Diagnosis not present

## 2022-06-01 NOTE — Progress Notes (Signed)
   Subjective:    Patient ID: Kristina Maldonado, female    DOB: [birth year], 81 y.o.   MRN: [id number]  HPI She complains of a 1 month history of bilateral shoulder pain and now she is also noted some pain in the right knee area.  She does complain of a headache but points to the top of her head this to the main area.  No blurred or double vision, nausea or vomiting or true weakness.  She has been using Advil and Voltaren cream with some relief of her symptoms.  She also complains of bilateral lower leg edema that does tend to get better if she uses support stockings it is also noted to be less bothersome when she wakes up in the morning.  No chest pain, shortness of breath PND or DOE   Review of Systems     Objective:   Physical Exam No palpable tenderness to her arms or shoulders.  Neck is supple without adenopathy no tenderness over temporal arteries.  TMs clear.  Cardiac exam shows regular rhythm without murmurs gallops.  Lungs clear to auscultation.  DTRs are normal.       Assessment & Plan:  Myalgia - Plan: Sedimentation rate, CBC with Differential/Platelet, Comprehensive metabolic panel  Nonintractable headache, unspecified chronicity pattern, unspecified headache type  Dependent edema Suspect PMR but will wait on the lab results.  Continue with Advil and Voltaren.  Did recommend she wear support stockings when she gets up in the morning to help with her edema explained that this is gravity related and not related to any other major medical problems at least at this point

## 2022-06-02 LAB — COMPREHENSIVE METABOLIC PANEL
ALT: 8 IU/L (ref 0–32)
AST: 19 IU/L (ref 0–40)
Albumin/Globulin Ratio: 1.5 (ref 1.2–2.2)
Albumin: 4.1 g/dL (ref 3.8–4.8)
Alkaline Phosphatase: 72 IU/L (ref 44–121)
BUN/Creatinine Ratio: 15 (ref 12–28)
BUN: 16 mg/dL (ref 8–27)
Bilirubin Total: 0.3 mg/dL (ref 0.0–1.2)
CO2: 25 mmol/L (ref 20–29)
Calcium: 10.4 mg/dL — ABNORMAL HIGH (ref 8.7–10.3)
Chloride: 101 mmol/L (ref 96–106)
Creatinine, Ser: 1.06 mg/dL — ABNORMAL HIGH (ref 0.57–1.00)
Globulin, Total: 2.7 g/dL (ref 1.5–4.5)
Glucose: 104 mg/dL — ABNORMAL HIGH (ref 70–99)
Potassium: 3.9 mmol/L (ref 3.5–5.2)
Sodium: 138 mmol/L (ref 134–144)
Total Protein: 6.8 g/dL (ref 6.0–8.5)
eGFR: 53 mL/min/{1.73_m2} — ABNORMAL LOW (ref >59)

## 2022-06-02 LAB — CBC WITH DIFFERENTIAL/PLATELET
Basophils Absolute: 0 10*3/uL (ref 0.0–0.2)
Basos: 1 %
EOS (ABSOLUTE): 0.1 10*3/uL (ref 0.0–0.4)
Eos: 2 %
Hematocrit: 40 % (ref 34.0–46.6)
Hemoglobin: 13.5 g/dL (ref 11.1–15.9)
Immature Grans (Abs): 0 10*3/uL (ref 0.0–0.1)
Immature Granulocytes: 0 %
Lymphocytes Absolute: 2.2 10*3/uL (ref 0.7–3.1)
Lymphs: 52 %
MCH: 32.2 pg (ref 26.6–33.0)
MCHC: 33.8 g/dL (ref 31.5–35.7)
MCV: 96 fL (ref 79–97)
Monocytes Absolute: 0.4 10*3/uL (ref 0.1–0.9)
Monocytes: 9 %
Neutrophils Absolute: 1.5 10*3/uL (ref 1.4–7.0)
Neutrophils: 36 %
Platelets: 182 10*3/uL (ref 150–450)
RBC: 4.19 x10E6/uL (ref 3.77–5.28)
RDW: 12.7 % (ref 11.7–15.4)
WBC: 4.3 10*3/uL (ref 3.4–10.8)

## 2022-06-02 LAB — SEDIMENTATION RATE: Sed Rate: 21 mm/hr (ref 0–40)

## 2022-06-05 NOTE — Progress Notes (Signed)
  Subjective:  Patient ID: Kristina Maldonado, female    DOB: 02/03/1941,  MRN: [id number]  Kristina Maldonado presents to clinic today for corn(s) left lower extremity and painful thick toenails that are difficult to trim. Painful toenails interfere with ambulation. Aggravating factors include wearing enclosed shoe gear. Pain is relieved with periodic professional debridement. Painful corns are aggravated when weightbearing when wearing enclosed shoe gear. Pain is relieved with periodic professional debridement.  Patient states she attempted to trim her left 5th toe corn with a razor. Also states both great toes are sore. She did get a pedicure about a month ago.  PCP is Ronnald Nian, MD , and last visit was  February 13, 2022  No Known Allergies  Review of Systems: Negative except as noted in the HPI.  Objective: No changes noted in today's physical examination. Constitutional Kristina Maldonado is a pleasant 81 y.o. African American female, WD, WN in NAD. AAO x 3.   Vascular CFT <3 seconds b/l LE. Palpable DP pulse(s) b/l LE. Diminished PT pulse(s) b/l LE. No pain with calf compression b/l. Lower extremity skin temperature gradient within normal limits. No edema noted b/l LE. No cyanosis or clubbing noted b/l LE.  Neurologic Normal speech. Oriented to person, place, and time. Protective sensation intact 5/5 intact bilaterally with 10g monofilament b/l. Vibratory sensation intact b/l.  Dermatologic Pedal integument with normal turgor, texture and tone b/l LE. No open wounds b/l. No interdigital macerations b/l. Toenails 2-5 b/l elongated, thickened, discolored with subungual debris. +Tenderness with dorsal palpation of nailplates. Incurvated nailplate both borders of left hallux and both borders of right hallux with tenderness to palpation. No erythema, no edema, no drainage noted. No fluctuance. Hyperkeratotic lesion(s) noted L 5th toe.  Orthopedic: Normal muscle strength 5/5 to all lower extremity  muscle groups bilaterally. Hammertoe(s) noted to the bilateral 5th toes.. No pain, crepitus or joint limitation noted with ROM b/l LE.  Patient ambulates independently without assistive aids.   Radiographs: None  Assessment/Plan: No diagnosis found.   -Examined patient. -Medicare ABN signed for services of paring of corn(s)/callus(es)/porokeratos(es). Copy in patient chart. -Patient to continue soft, supportive shoe gear daily. -Toenails 2-5 bilaterally debrided in length and girth without iatrogenic bleeding with sterile nail nipper and dremel.  -Offending nail border debrided and curretaged bilateral great toes utilizing sterile nail nipper and currette. Border cleansed with alcohol and triple antibiotic applied. No further treatment required by patient/caregiver. Call office if there are any concerns. -Corn(s) L 5th toe pared utilizing sterile scalpel blade without complication or incident. Total number debrided=1. -Patient/POA to call should there be question/concern in the interim.   Return in about 3 months (around 08/31/2022).  Freddie Breech, DPM

## 2022-06-11 ENCOUNTER — Other Ambulatory Visit: Payer: Self-pay | Admitting: Family Medicine

## 2022-06-11 DIAGNOSIS — I10 Essential (primary) hypertension: Secondary | ICD-10-CM

## 2022-06-28 ENCOUNTER — Encounter: Payer: Self-pay | Admitting: Internal Medicine

## 2022-07-05 ENCOUNTER — Other Ambulatory Visit (INDEPENDENT_AMBULATORY_CARE_PROVIDER_SITE_OTHER): Payer: Medicare Other

## 2022-07-05 DIAGNOSIS — Z23 Encounter for immunization: Secondary | ICD-10-CM | POA: Diagnosis not present

## 2022-08-01 ENCOUNTER — Encounter: Payer: Self-pay | Admitting: Internal Medicine

## 2022-08-14 ENCOUNTER — Encounter: Payer: Self-pay | Admitting: Internal Medicine

## 2022-08-21 ENCOUNTER — Ambulatory Visit
Admission: RE | Admit: 2022-08-21 | Discharge: 2022-08-21 | Disposition: A | Discharge status: Home / Self Care | Payer: Medicare Other | Source: Ambulatory Visit | Attending: Family Medicine | Admitting: Family Medicine

## 2022-08-21 DIAGNOSIS — M8589 Other specified disorders of bone density and structure, multiple sites: Secondary | ICD-10-CM | POA: Diagnosis not present

## 2022-08-21 DIAGNOSIS — Z780 Asymptomatic menopausal state: Secondary | ICD-10-CM | POA: Diagnosis not present

## 2022-08-22 ENCOUNTER — Telehealth: Payer: Self-pay | Admitting: Family Medicine

## 2022-08-22 NOTE — Telephone Encounter (Signed)
Pt's daughter Olin Hauser [facility]) called & states she got the bone density results and pt already takes multi vitamin with Vit D & also takes OTC Calcium daily.  Should she be taking more of either or is there anything else she can take to help her bone density.  Please call her cell# or respond in mychart.

## 2022-08-24 ENCOUNTER — Other Ambulatory Visit (INDEPENDENT_AMBULATORY_CARE_PROVIDER_SITE_OTHER): Payer: Medicare Other

## 2022-08-24 DIAGNOSIS — Z23 Encounter for immunization: Secondary | ICD-10-CM | POA: Diagnosis not present

## 2022-08-24 NOTE — Telephone Encounter (Signed)
Left message for [facility] with Dr. Sydnee Cabal instructions

## 2022-08-25 ENCOUNTER — Telehealth: Payer: Self-pay

## 2022-08-25 NOTE — Telephone Encounter (Signed)
Pt. Daughter call back stating that her moms recent DEXA scan recommendations were to take calcium and vitamin d. She said she already takes both of those. She takes 1,000 unit Vit D and she thinks her calcium is 300-600mg . So she wanted to know what unit of vitamin D should see increase her to and how much calcium should she be taking per day.

## 2022-09-08 ENCOUNTER — Other Ambulatory Visit: Payer: Self-pay | Admitting: Family Medicine

## 2022-09-08 DIAGNOSIS — I10 Essential (primary) hypertension: Secondary | ICD-10-CM

## 2022-09-20 ENCOUNTER — Ambulatory Visit: Payer: Medicare Other | Admitting: Podiatry

## 2022-09-20 ENCOUNTER — Encounter: Payer: Self-pay | Admitting: Podiatry

## 2022-09-20 DIAGNOSIS — M79675 Pain in left toe(s): Secondary | ICD-10-CM | POA: Diagnosis not present

## 2022-09-20 DIAGNOSIS — L84 Corns and callosities: Secondary | ICD-10-CM

## 2022-09-20 DIAGNOSIS — B351 Tinea unguium: Secondary | ICD-10-CM | POA: Diagnosis not present

## 2022-09-20 DIAGNOSIS — M79674 Pain in right toe(s): Secondary | ICD-10-CM

## 2022-09-23 NOTE — Progress Notes (Signed)
  Subjective:  Patient ID: Kristina Maldonado, female    DOB: 05-12-1941,  MRN: [id number]  Kristina Maldonado presents to clinic today for corn(s) left lower extremity and painful thick toenails that are difficult to trim. Painful toenails interfere with ambulation. Aggravating factors include wearing enclosed shoe gear. Pain is relieved with periodic professional debridement. Painful corns are aggravated when weightbearing when wearing enclosed shoe gear. Pain is relieved with periodic professional debridement.   Chief Complaint  Patient presents with   Nail Problem    Routine foot care PCP-[name] PCP VST-Early 2023   New problem(s): None.   PCP is Ronnald Nian, MD.  No Known Allergies  Review of Systems: Negative except as noted in the HPI.  Objective: No changes noted in today's physical examination. There were no vitals filed for this visit.  Kristina Maldonado is a pleasant 81 y.o. female WD, WN in NAD. AAO x 3.  Vascular CFT <3 seconds b/l LE. Palpable DP pulse(s) b/l LE. Diminished PT pulse(s) b/l LE. No pain with calf compression b/l. Lower extremity skin temperature gradient within normal limits. No edema noted b/l LE. No cyanosis or clubbing noted b/l LE.  Neurologic Normal speech. Oriented to person, place, and time. Protective sensation intact 5/5 intact bilaterally with 10g monofilament b/l. Vibratory sensation intact b/l.  Dermatologic Pedal integument with normal turgor, texture and tone b/l LE. No open wounds b/l. No interdigital macerations b/l. Toenails 2-5 b/l elongated, thickened, discolored with subungual debris. +Tenderness with dorsal palpation of nailplates. Incurvated nailplate both borders of left hallux and both borders of right hallux with tenderness to palpation. No erythema, no edema, no drainage noted. No fluctuance. Hyperkeratotic lesion(s) noted L 5th toe.  Orthopedic: Normal muscle strength 5/5 to all lower extremity muscle groups bilaterally. Hammertoe(s)  noted to the bilateral 5th toes.. No pain, crepitus or joint limitation noted with ROM b/l LE.  Patient ambulates independently without assistive aids.   Radiographs: None  Assessment/Plan: 1. Pain due to onychomycosis of toenails of both feet   2. Corn left 5th digit   3. Pain in left toe(s)     No orders of the defined types were placed in this encounter.   -Patient was evaluated and treated. All patient's and/or POA's questions/concerns answered on today's visit. -Discussed shoe gear. Encouraged patient to wear shoes with wide width. -Toenails 1-5 b/l were debrided in length and girth with sterile nail nippers and dremel without iatrogenic bleeding.  -Corn(s) L 5th toe pared utilizing sterile scalpel blade without complication or incident. Total number debrided=1. -Dispensed tube foam. Apply to L 5th toe every morning. Remove every evening. -Patient/POA to call should there be question/concern in the interim.   Return in about 3 months (around 12/21/2022).  Kristina Maldonado, DPM

## 2022-09-28 ENCOUNTER — Telehealth: Payer: Self-pay | Admitting: Family Medicine

## 2022-09-28 NOTE — Telephone Encounter (Signed)
Pt's daughter called and states that they are going on a 7 day cruise for christmas. Last cruise seas were rough and pt got a little vertigo. Pt would like a recommendation for something pt can take. Please advise.

## 2022-09-28 NOTE — Telephone Encounter (Signed)
Pt daughter advised.

## 2022-11-29 ENCOUNTER — Other Ambulatory Visit: Payer: Self-pay | Admitting: Family Medicine

## 2022-11-29 DIAGNOSIS — E785 Hyperlipidemia, unspecified: Secondary | ICD-10-CM

## 2022-11-29 DIAGNOSIS — I10 Essential (primary) hypertension: Secondary | ICD-10-CM

## 2022-12-14 DIAGNOSIS — H524 Presbyopia: Secondary | ICD-10-CM | POA: Diagnosis not present

## 2022-12-14 DIAGNOSIS — R7309 Other abnormal glucose: Secondary | ICD-10-CM | POA: Diagnosis not present

## 2022-12-14 DIAGNOSIS — H25813 Combined forms of age-related cataract, bilateral: Secondary | ICD-10-CM | POA: Diagnosis not present

## 2022-12-14 DIAGNOSIS — H40023 Open angle with borderline findings, high risk, bilateral: Secondary | ICD-10-CM | POA: Diagnosis not present

## 2022-12-14 DIAGNOSIS — H04123 Dry eye syndrome of bilateral lacrimal glands: Secondary | ICD-10-CM | POA: Diagnosis not present

## 2022-12-14 LAB — HM DIABETES EYE EXAM

## 2022-12-24 IMAGING — MG MM DIGITAL SCREENING BILAT W/ TOMO AND CAD
8 series · 8 of 24 positions shown · non-contrast
Comparison: Previous exam(s).

CLINICAL DATA: Screening.

EXAM:
DIGITAL SCREENING BILATERAL MAMMOGRAM WITH TOMOSYNTHESIS AND CAD
TECHNIQUE: Bilateral screening digital craniocaudal and mediolateral oblique
mammograms were obtained. Bilateral screening digital breast
tomosynthesis was performed. The images were evaluated with
computer-aided detection.

[R MLO synth-2D]
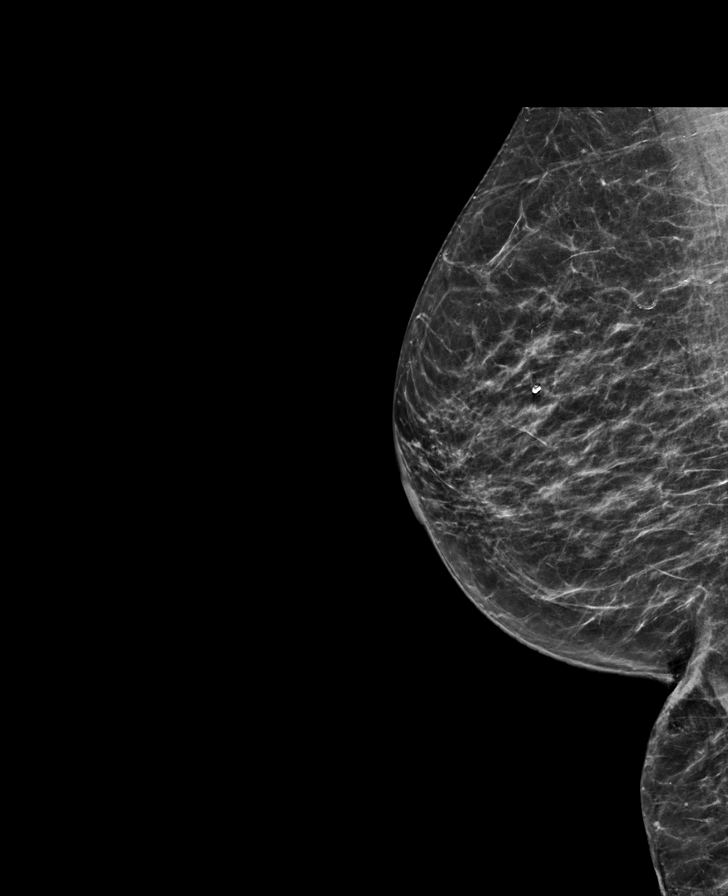

[L CC synth-2D]
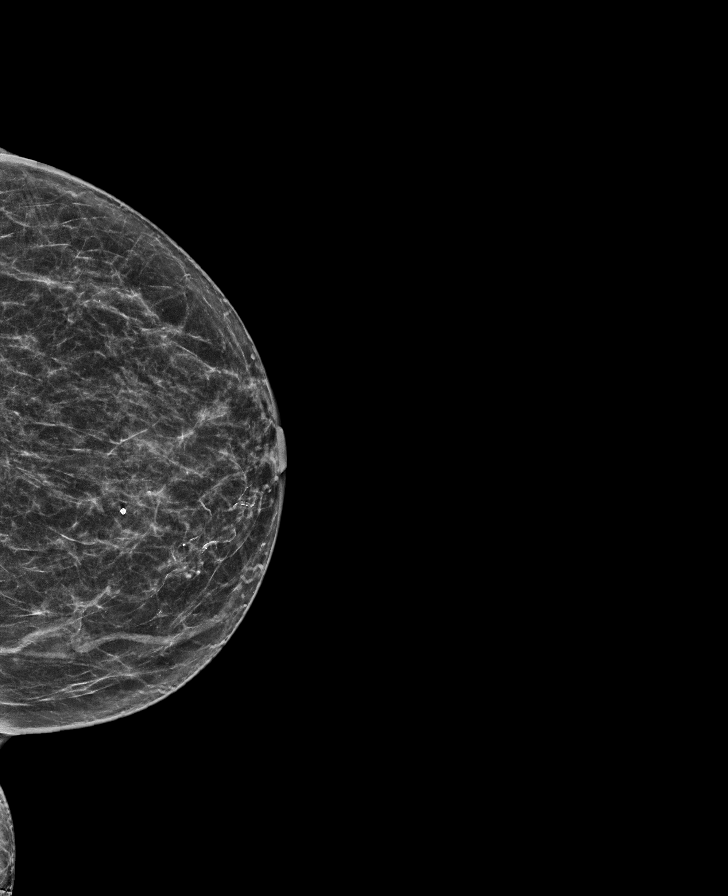

[R CC synth-2D]
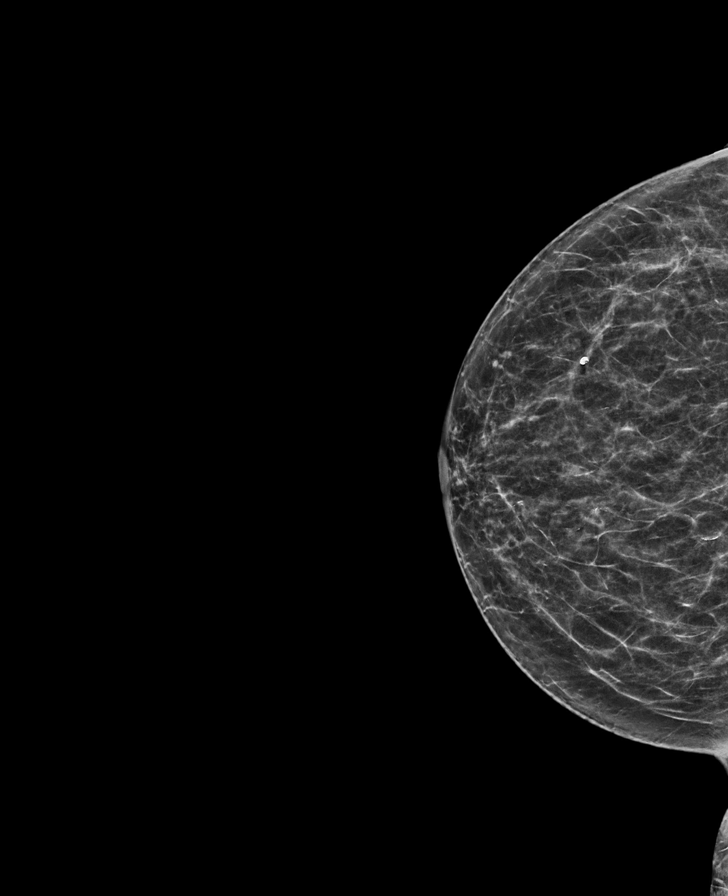

[L MLO synth-2D]
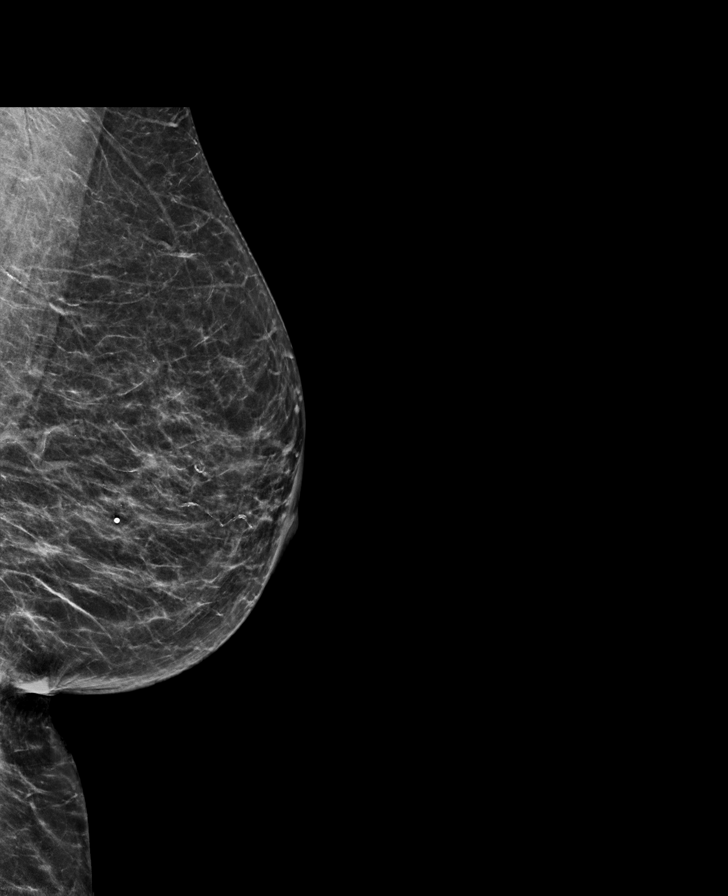

[R CC tomo · tomo slice 31/62.0]
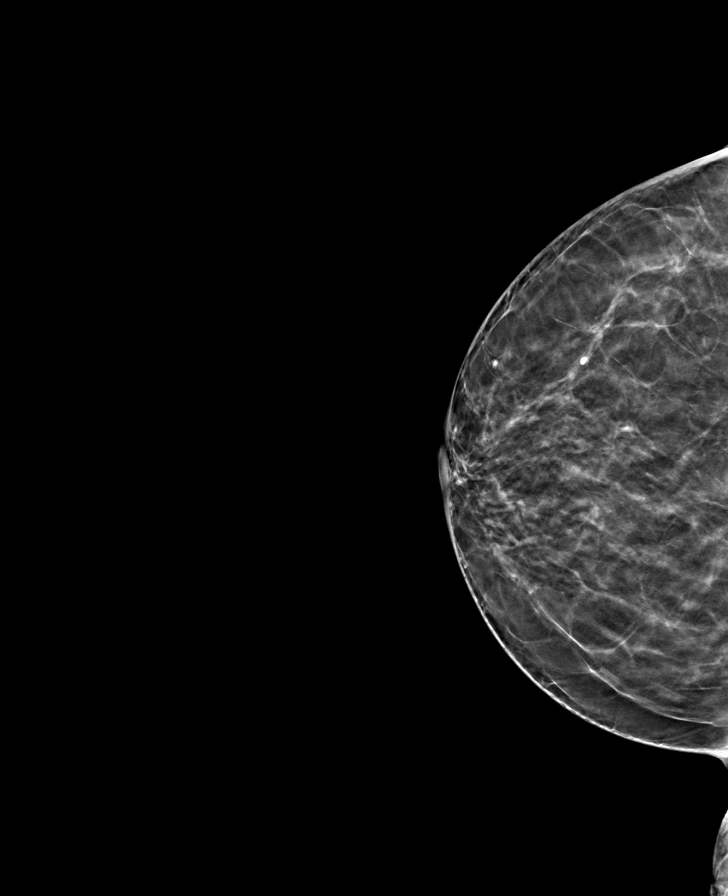

[L CC tomo · tomo slice 30/59.0]
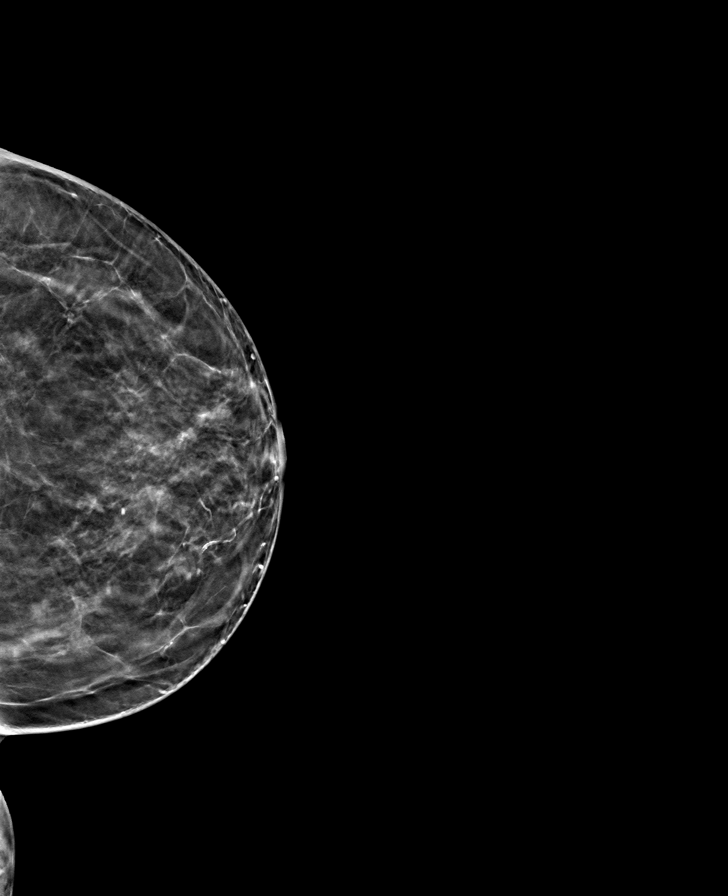

[R MLO tomo · tomo slice 31/62.0]
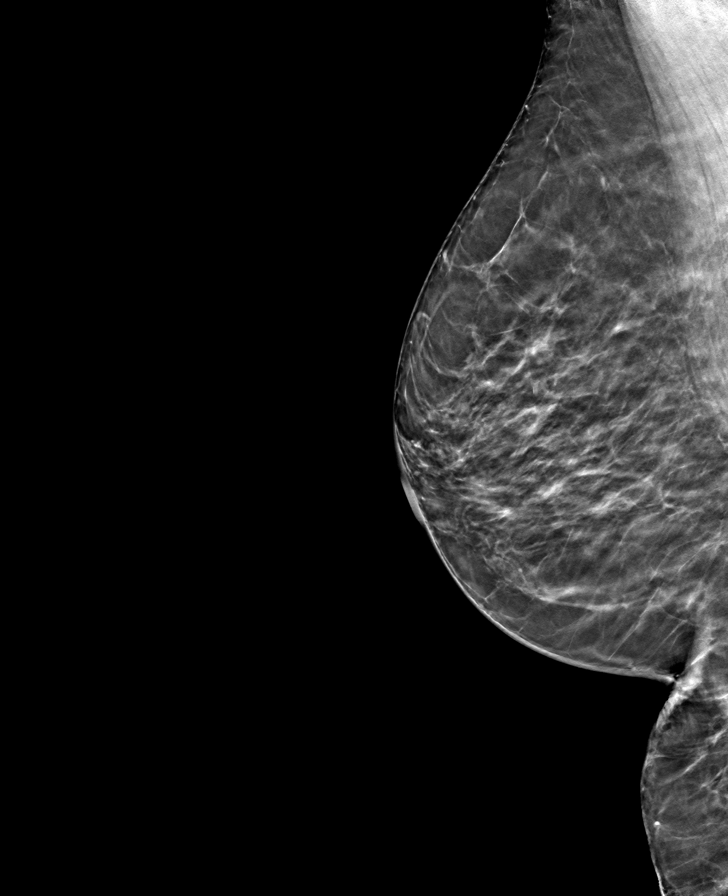

[L MLO tomo · tomo slice 31/62.0]
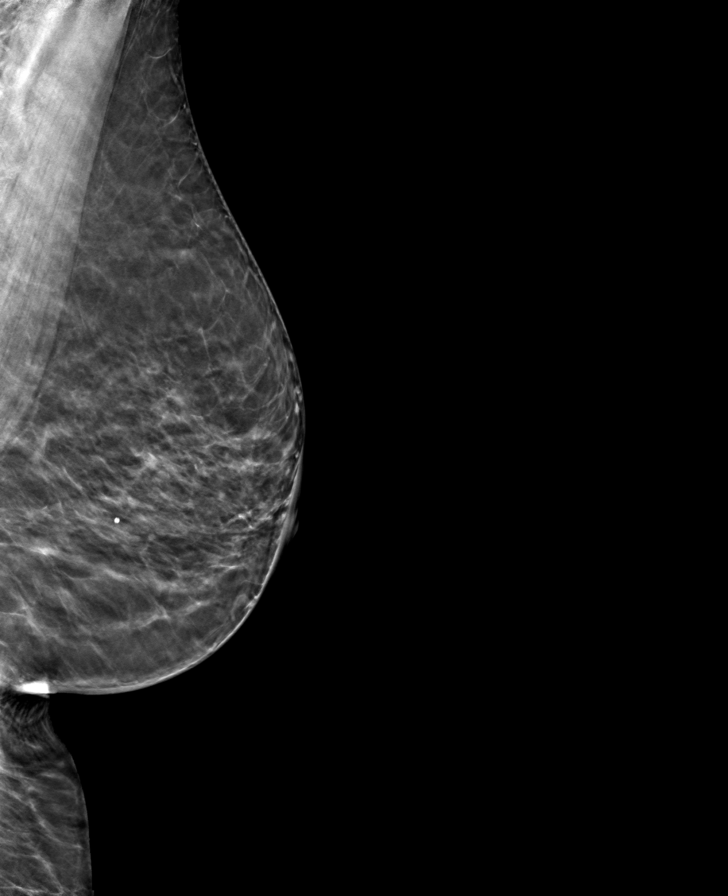

[8 of 24 positions shown; findings below may reference images not displayed]

ACR Breast Density Category b: There are scattered areas of
fibroglandular density.
FINDINGS: There are no findings suspicious for malignancy.
IMPRESSION: No mammographic evidence of malignancy. A result letter of this
screening mammogram will be mailed directly to the patient.

RECOMMENDATION:
Screening mammogram in one year. (Code:51-O-LD2)

BI-RADS CATEGORY  1: Negative.

## 2023-01-02 ENCOUNTER — Encounter: Payer: Self-pay | Admitting: Family Medicine

## 2023-01-02 ENCOUNTER — Ambulatory Visit (INDEPENDENT_AMBULATORY_CARE_PROVIDER_SITE_OTHER): Payer: Medicare Other | Admitting: Family Medicine

## 2023-01-02 VITALS — BP 128/80 | HR 50 | Temp 97.7°F | Resp 16 | Wt 184.6 lb

## 2023-01-02 DIAGNOSIS — K5900 Constipation, unspecified: Secondary | ICD-10-CM | POA: Diagnosis not present

## 2023-01-02 NOTE — Patient Instructions (Signed)

## 2023-01-02 NOTE — Progress Notes (Signed)
   Subjective:    Patient ID: Kristina Maldonado, female    DOB: Oct 13, 1941, 82 y.o.   MRN: [id number]  HPI Complains of intermittent constipation for the last several months.  She normally has a BM every 2 or 3 days.  She has been using over-the-counter anticonstipation medications with but with not much success.  She does not exercise regularly.  She has had no abdominal pain nausea or vomiting.   Review of Systems     Objective:   Physical Exam Alert and in no distress.  Abdominal exam shows decreased bowel sounds without masses or tenderness.       Assessment & Plan:   Constipation, unspecified constipation type Information concerning constipation was given including diet and especially exercise to help keep her regular.  She is to use MiraLAX on a regular basis.  Explained that regular BMs do not necessarily mean on a daily basis.

## 2023-01-05 ENCOUNTER — Ambulatory Visit: Payer: Medicare Other | Admitting: Podiatry

## 2023-01-05 ENCOUNTER — Encounter: Payer: Self-pay | Admitting: Podiatry

## 2023-01-05 VITALS — BP 126/73 | HR 69

## 2023-01-05 DIAGNOSIS — L84 Corns and callosities: Secondary | ICD-10-CM | POA: Diagnosis not present

## 2023-01-05 DIAGNOSIS — M79674 Pain in right toe(s): Secondary | ICD-10-CM

## 2023-01-05 DIAGNOSIS — M79675 Pain in left toe(s): Secondary | ICD-10-CM

## 2023-01-05 DIAGNOSIS — B351 Tinea unguium: Secondary | ICD-10-CM

## 2023-01-05 NOTE — Progress Notes (Unsigned)
  Subjective:  Patient ID: Kristina Maldonado, female    DOB: 01-22-1941,  MRN: [id number]  Kristina Maldonado presents to clinic today for corn(s) left foot and painful thick toenails that are difficult to trim. Painful toenails interfere with ambulation. Aggravating factors include wearing enclosed shoe gear. Pain is relieved with periodic professional debridement. Painful corns are aggravated when weightbearing when wearing enclosed shoe gear. Pain is relieved with periodic professional debridement.  Chief Complaint  Patient presents with   Nail Problem    Trim,not diabetic,A1C:5.8:[name], [name], MD-PCP B/P meds taken today,seen pcp March-2024   New problem(s): None.   PCP is Denita Lung, MD.  No Known Allergies  Review of Systems: Negative except as noted in the HPI.  Objective: No changes noted in today's physical examination. Vitals:   01/05/23 0903  BP: 126/73  Pulse: 40   Kristina Maldonado is a pleasant 82 y.o. female WD, WN in NAD. AAO x 3.  Vascular CFT <3 seconds b/l LE. Palpable DP pulse(s) b/l LE. Diminished PT pulse(s) b/l LE. No pain with calf compression b/l. Lower extremity skin temperature gradient within normal limits. No edema noted b/l LE. No cyanosis or clubbing noted b/l LE.  Neurologic Normal speech. Oriented to person, place, and time. Protective sensation intact 5/5 intact bilaterally with 10g monofilament b/l. Vibratory sensation intact b/l.  Dermatologic Pedal integument with normal turgor, texture and tone b/l LE. No open wounds b/l. No interdigital macerations b/l. Toenails 2-5 b/l elongated, thickened, discolored with subungual debris. +Tenderness with dorsal palpation of nailplates. Incurvated nailplate both borders of left hallux and both borders of right hallux with tenderness to palpation. No erythema, no edema, no drainage noted. No fluctuance. Hyperkeratotic lesion(s) noted L 5th toe.  Orthopedic: Normal muscle strength 5/5 to all lower extremity muscle  groups bilaterally. Hammertoe(s) noted to the bilateral 5th toes.. No pain, crepitus or joint limitation noted with ROM b/l LE.  Patient ambulates independently without assistive aids.   Radiographs: None  Assessment/Plan: 1. Pain due to onychomycosis of toenails of both feet   2. Corn left 5th digit   3. Pain in left toe(s)    -Consent given for treatment as described below: -Examined patient. -Toenails 1-5 b/l were debrided in length and girth with sterile nail nippers and dremel without iatrogenic bleeding.  -Corn(s) left fifth digit pared utilizing sharp debridement with sterile blade without complication or incident. Total number debrided=1. -Patient/POA to call should there be question/concern in the interim.   Return in about 3 months (around 04/07/2023).  Marzetta Board, DPM

## 2023-02-07 ENCOUNTER — Other Ambulatory Visit: Payer: Self-pay | Admitting: Family Medicine

## 2023-02-07 DIAGNOSIS — E785 Hyperlipidemia, unspecified: Secondary | ICD-10-CM

## 2023-02-07 DIAGNOSIS — I10 Essential (primary) hypertension: Secondary | ICD-10-CM

## 2023-02-15 ENCOUNTER — Ambulatory Visit (INDEPENDENT_AMBULATORY_CARE_PROVIDER_SITE_OTHER): Payer: Medicare Other | Admitting: Family Medicine

## 2023-02-15 ENCOUNTER — Encounter: Payer: Self-pay | Admitting: Family Medicine

## 2023-02-15 VITALS — BP 130/78 | HR 68 | Temp 97.6°F | Resp 14 | Ht 65.0 in | Wt 189.6 lb

## 2023-02-15 DIAGNOSIS — E669 Obesity, unspecified: Secondary | ICD-10-CM

## 2023-02-15 DIAGNOSIS — N3281 Overactive bladder: Secondary | ICD-10-CM

## 2023-02-15 DIAGNOSIS — Z780 Asymptomatic menopausal state: Secondary | ICD-10-CM

## 2023-02-15 DIAGNOSIS — Z79899 Other long term (current) drug therapy: Secondary | ICD-10-CM | POA: Diagnosis not present

## 2023-02-15 DIAGNOSIS — E785 Hyperlipidemia, unspecified: Secondary | ICD-10-CM | POA: Diagnosis not present

## 2023-02-15 DIAGNOSIS — M8580 Other specified disorders of bone density and structure, unspecified site: Secondary | ICD-10-CM

## 2023-02-15 DIAGNOSIS — I10 Essential (primary) hypertension: Secondary | ICD-10-CM | POA: Diagnosis not present

## 2023-02-15 DIAGNOSIS — H269 Unspecified cataract: Secondary | ICD-10-CM | POA: Diagnosis not present

## 2023-02-15 DIAGNOSIS — Z0000 Encounter for general adult medical examination without abnormal findings: Secondary | ICD-10-CM | POA: Diagnosis not present

## 2023-02-15 DIAGNOSIS — J301 Allergic rhinitis due to pollen: Secondary | ICD-10-CM

## 2023-02-15 LAB — CBC WITH DIFFERENTIAL/PLATELET
Basophils Absolute: 0.1 10*3/uL (ref 0.0–0.2)
Basos: 1 %
Hematocrit: 40.5 % (ref 34.0–46.6)
Immature Granulocytes: 0 %
Lymphs: 45 %
MCV: 97 fL (ref 79–97)
WBC: 3.8 10*3/uL (ref 3.4–10.8)

## 2023-02-15 LAB — COMPREHENSIVE METABOLIC PANEL

## 2023-02-15 LAB — LIPID PANEL

## 2023-02-15 MED ORDER — MIRABEGRON ER 25 MG PO TB24: 25.0000 mg | ORAL_TABLET | Freq: Every day | ORAL | 3 refills | Status: DC

## 2023-02-15 MED ORDER — ATORVASTATIN CALCIUM 40 MG PO TABS: 40.0000 mg | ORAL_TABLET | Freq: Every day | ORAL | 3 refills | Status: DC

## 2023-02-15 MED ORDER — AMLODIPINE BESYLATE 10 MG PO TABS: 10.0000 mg | ORAL_TABLET | Freq: Every day | ORAL | 3 refills | Status: DC

## 2023-02-15 MED ORDER — LOSARTAN POTASSIUM-HCTZ 50-12.5 MG PO TABS
1.0000 | ORAL_TABLET | Freq: Every day | ORAL | 3 refills | Status: DC
Start: 2023-02-15 — End: 2023-05-16

## 2023-02-15 NOTE — Progress Notes (Addendum)
Kristina Maldonado is a 82 y.o. female who presents for annual wellness visit,CPE and follow-up on chronic medical conditions.  She has OAB and for some reason the Myrbetriq was not renewed.  It apparently was for electively and expensive.  I will place her back on that.  She continues on losartan and amlodipine for her blood pressure and having no difficulty with that.  She continues on atorvastatin and having no symptoms from that.  She does have a history of osteopenia and is using vitamin D and calcium.  Her allergies are under good control.  She does see ophthalmology regularly for her cataracts and as yet has not had to have surgery.  Otherwise she has no particular concerns or complaints.  Immunizations and Health Maintenance Immunization History  Administered Date(s) Administered   COVID-19, mRNA, vaccine(Comirnaty)12 years and older 08/24/2022   Fluad Quad(high Dose 65+) 06/17/2019, 08/03/2021, 07/05/2022   Influenza Split 08/22/2010, 09/11/2011, 09/03/2012   Influenza, High Dose Seasonal PF 07/08/2013, 07/14/2014, 09/21/2015, 06/28/2017   Influenza, Seasonal, Injecte, Preservative Fre 06/17/2019   Influenza-Unspecified 08/01/2016, 06/28/2017   Moderna Covid-19 Vaccine Bivalent Booster 5yrs & up 10/11/2021   Moderna SARS-COV2 Booster Vaccination 03/24/2021   Moderna Sars-Covid-2 Vaccination 11/03/2019, 12/01/2019, 08/23/2020   Pneumococcal Conjugate-13 07/14/2014   Pneumococcal Polysaccharide-23 07/07/2009   Td 08/02/1999   Tdap 09/19/2010, 01/27/2020   Zoster Recombinat (Shingrix) 12/29/2016, 04/18/2017   Zoster, Live 09/24/2006   Health Maintenance Due  Topic Date Due   COVID-19 Vaccine (6 - 2023-24 season) 10/19/2022    Last Pap smear:n/a Last mammogram:04/05/2022 Last colonoscopy: 01/08/2018 Last DEXA: 08/21/2022 Dentist: within the last year Ophtho: within the last year Exercise: none  Other doctors caring for patient include: Podiatry- Geralynn Rile DPM  Advanced  directives: Yes.     Depression screen:  See questionnaire below.     02/15/2023    9:00 AM 02/13/2022    9:31 AM 02/07/2021    8:33 AM 11/28/2019    8:32 AM 11/26/2018    8:40 AM  Depression screen PHQ 2/9  Decreased Interest 0 0 0 0 0  Down, Depressed, Hopeless 0 0 0 0 0  PHQ - 2 Score 0 0 0 0 0    Fall Risk Screen: see questionnaire below.    01/02/2023    9:45 AM 02/13/2022    9:30 AM 02/07/2021    8:33 AM 11/28/2019    8:32 AM 11/26/2018    8:40 AM  Fall Risk   Falls in the past year? 0 0 0 0 0  Number falls in past yr: 0 0 0  0  Injury with Fall? 0 0 0  0  Risk for fall due to : No Fall Risks No Fall Risks No Fall Risks    Follow up Falls prevention discussed Falls evaluation completed Falls evaluation completed      ADL screen:  See questionnaire below Functional Status Survey: Is the patient deaf or have difficulty hearing?: No Does the patient have difficulty seeing, even when wearing glasses/contacts?: No Does the patient have difficulty concentrating, remembering, or making decisions?: No Does the patient have difficulty walking or climbing stairs?: No Does the patient have difficulty dressing or bathing?: No Does the patient have difficulty doing errands alone such as visiting a doctor's office or shopping?: No   Review of Systems Constitutional: -, -unexpected weight change, -anorexia, -fatigue Allergy: -sneezing, -itching, -congestion Dermatology: denies changing moles, rash, lumps ENT: -runny nose, -ear pain, -sore throat,  Cardiology:  -chest  pain, -palpitations, -orthopnea, Respiratory: -cough, -shortness of breath, -dyspnea on exertion, -wheezing,  Gastroenterology: -abdominal pain, -nausea, -vomiting, -diarrhea, -constipation, -dysphagia Hematology: -bleeding or bruising problems Musculoskeletal: -arthralgias, -myalgias, -joint swelling, -back pain, - Ophthalmology: -vision changes,  Urology: -dysuria, -difficulty urinating,  -urinary frequency, -urgency,  incontinence Neurology: -, -numbness, , -memory loss, -falls, -dizziness    PHYSICAL EXAM:  BP 130/78   Pulse 68   Temp 97.6 F (36.4 C) (Oral)   Resp 14   Ht  (1.651 m)   Wt 189 lb 9.6 oz (86 kg)   SpO2 99% Comment: room air  BMI 31.55 kg/m   General Appearance: Alert, cooperative, no distress, appears stated age Head: Normocephalic, without obvious abnormality, atraumatic Eyes: PERRL, conjunctiva/corneas clear, EOM's intact, Ears: Normal TM's and external ear canals Nose: Nares normal, mucosa normal, no drainage or sinus tenderness Throat: Lips, mucosa, and tongue normal; teeth and gums normal Neck: Supple, no lymphadenopathy;  thyroid:  no enlargement/tenderness/nodules; no carotid bruit or JVD Lungs: Clear to auscultation bilaterally without wheezes, rales or ronchi; respirations unlabored Heart: Regular rate and rhythm, S1 and S2 normal, no murmur, rubor gallop Abdomen: Soft, non-tender, nondistended, normoactive bowel sounds,  no masses, no hepatosplenomegaly Skin:  Skin color, texture, turgor normal, no rashes or lesions Lymph nodes: Cervical, supraclavicular, and axillary nodes normal Neurologic:  CNII-XII intact, normal strength, sensation and gait; reflexes 2+ and symmetric throughout Psych: Normal mood, affect, hygiene and grooming.  ASSESSMENT/PLAN: Routine general medical examination at a health care facility  Primary hypertension - Plan: amLODipine (NORVASC) 10 MG tablet, losartan-hydrochlorothiazide (HYZAAR) 50-12.5 MG tablet, CBC with Differential/Platelet, Comprehensive metabolic panel  Allergic rhinitis due to pollen, unspecified seasonality  Osteopenia after menopause  OAB (overactive bladder) - Plan: mirabegron ER (MYRBETRIQ) 25 MG TB24 tablet  Obesity without serious comorbidity, unspecified classification, unspecified obesity type  Hyperlipidemia, unspecified hyperlipidemia type - Plan: atorvastatin (LIPITOR) 40 MG tablet, Lipid  panel  Encounter for long-term (current) use of medications  Cataract of both eyes, unspecified cataract type    Discussed goals of calcium and vitamin D intake Immunization recommendations discussed.  She is to get the RSV.  Colonoscopy recommendations reviewed   Medicare Attestation I have personally reviewed: The patient's medical and social history Their use of alcohol, tobacco or illicit drugs Their current medications and supplements The patient's functional ability including ADLs,fall risks, home safety risks, cognitive, and hearing and visual impairment Diet and physical activities Evidence for depression or mood disorders  The patient's weight, height, and BMI have been recorded in the chart.  I have made referrals, counseling, and provided education to the patient based on review of the above and I have provided the patient with a written personalized care plan for preventive services.     Sharlot Gowda, MD   02/15/2023

## 2023-02-16 LAB — COMPREHENSIVE METABOLIC PANEL
ALT: 8 IU/L (ref 0–32)
AST: 24 IU/L (ref 0–40)
Albumin: 4.3 g/dL (ref 3.8–4.8)
Alkaline Phosphatase: 76 IU/L (ref 44–121)
BUN/Creatinine Ratio: 17 (ref 12–28)
BUN: 16 mg/dL (ref 8–27)
Bilirubin Total: 0.6 mg/dL (ref 0.0–1.2)
Calcium: 10.5 mg/dL — ABNORMAL HIGH (ref 8.7–10.3)
Globulin, Total: 2.5 g/dL (ref 1.5–4.5)
Glucose: 91 mg/dL (ref 70–99)
Potassium: 3.9 mmol/L (ref 3.5–5.2)
Sodium: 144 mmol/L (ref 134–144)
Total Protein: 6.8 g/dL (ref 6.0–8.5)
eGFR: 61 mL/min/{1.73_m2} (ref >59)

## 2023-02-16 LAB — LIPID PANEL
Chol/HDL Ratio: 2 ratio (ref 0.0–4.4)
Cholesterol, Total: 217 mg/dL — ABNORMAL HIGH (ref 100–199)
HDL: 111 mg/dL (ref >39)
LDL Chol Calc (NIH): 97 mg/dL (ref 0–99)
Triglycerides: 51 mg/dL (ref 0–149)

## 2023-02-16 LAB — CBC WITH DIFFERENTIAL/PLATELET
EOS (ABSOLUTE): 0.1 10*3/uL (ref 0.0–0.4)
Eos: 1 %
Hemoglobin: 13.4 g/dL (ref 11.1–15.9)
Immature Grans (Abs): 0 10*3/uL (ref 0.0–0.1)
Lymphocytes Absolute: 1.7 10*3/uL (ref 0.7–3.1)
MCH: 32.1 pg (ref 26.6–33.0)
MCHC: 33.1 g/dL (ref 31.5–35.7)
Monocytes Absolute: 0.3 10*3/uL (ref 0.1–0.9)
Monocytes: 8 %
Neutrophils Absolute: 1.7 10*3/uL (ref 1.4–7.0)
Neutrophils: 45 %
Platelets: 185 10*3/uL (ref 150–450)
RBC: 4.17 x10E6/uL (ref 3.77–5.28)
RDW: 13.2 % (ref 11.7–15.4)

## 2023-03-06 DIAGNOSIS — H43812 Vitreous degeneration, left eye: Secondary | ICD-10-CM | POA: Diagnosis not present

## 2023-03-13 ENCOUNTER — Ambulatory Visit: Payer: Medicare Other | Admitting: Podiatry

## 2023-03-13 DIAGNOSIS — L600 Ingrowing nail: Secondary | ICD-10-CM | POA: Diagnosis not present

## 2023-03-13 NOTE — Patient Instructions (Signed)

## 2023-03-13 NOTE — Progress Notes (Signed)
       Subjective:  Patient ID: Kristina Maldonado, female    DOB: 01-Mar-1941,  MRN: [id number]  Kristina Maldonado presents to clinic today for:  Chief Complaint  Patient presents with   Ingrown Toenail    Rm 23 Left medial hallux ingrown x 3 weeks. Soreness w/o drainage or bleeding.   Marland Kitchen  PCP is Ronnald Nian, MD.  No Known Allergies  Review of Systems: Negative except as noted in the HPI.  Objective:  There were no vitals filed for this visit.  LIZBEHT FRATTO is a pleasant 82 y.o. female in NAD. AAO x 3.  Vascular Examination: Capillary refill time is 3-5 seconds to toes bilateral. Palpable pedal pulses b/l LE. Digital hair present b/l. No pedal edema b/l. Skin temperature gradient WNL b/l. No varicosities b/l. No cyanosis or clubbing noted b/l.   Dermatological Examination: There is incurvation of the hallux medial nail border.  There is pain on palpation of the affected nail border.  There is no drainage noted.  Hypertrophy of the medial nail fold.  Neurological Examination: Protective sensation intact with Semmes-Weinstein 10 gram monofilament b/l LE. Vibratory sensation intact b/l LE.  Musculoskeletal Examination: Muscle strength 5/5 to all LE muscle groups b/l.   Assessment/Plan: 1. Ingrown toenail     Discussed patient's condition today as well as treatment options.    After obtaining patient consent, the left hallux was anesthetized with a 50:50 mixture of 1% lidocaine plain and 0.5% bupivacaine plain for a total of 3cc's administered.  Upon confirmation of anesthesia, a freer elevator was utilized to free the left hallux medial nail border from the nail bed.  The nail border was then avulsed proximal to the eponychium and removed in toto.  The area was inspected for any remaining spicules.  A chemical matrixectomy was performed with phenol and neutralized with alcohol solution.  Antibiotic ointment and a DSD were applied, followed by a Coban dressing.  Patient tolerated  the anesthetic and procedure well and will f/u in 2-3 weeks for recheck.  Patient given post-procedure instructions for Epsom salt soaks, antibiotic ointment and daily use of Bandaids until toe starts to dry / form eschar.    Return in about 2 weeks (around 03/27/2023) for PNA recheck.  She will also keep her appointment for her routine footcare in July   Kristina Maldonado D. Kristina Maldonado, DPM, FACFAS [facility]     [address] [place], [place] [address]                Office [phone]  Fax [phone]

## 2023-03-23 ENCOUNTER — Other Ambulatory Visit: Payer: Self-pay | Admitting: Family Medicine

## 2023-03-23 DIAGNOSIS — Z1231 Encounter for screening mammogram for malignant neoplasm of breast: Secondary | ICD-10-CM

## 2023-04-02 ENCOUNTER — Ambulatory Visit: Payer: Medicare Other | Admitting: Podiatry

## 2023-04-02 DIAGNOSIS — L600 Ingrowing nail: Secondary | ICD-10-CM | POA: Diagnosis not present

## 2023-04-02 NOTE — Progress Notes (Signed)
       Subjective:  Patient ID: Kristina Maldonado, female    DOB: 1941-10-09,  MRN: [id number]  Chief Complaint  Patient presents with   Ingrown Toenail    Rm 12 Nail check. Pt states she is doing well. No pain, drainage or bleeding. No signs of infection    Kristina Maldonado presents to clinic today for f/u of PNA to the left hallux medial nail border.  Patient denies any current drainage or pain to the area.  She states that she still occasionally performs an Epsom salt soak once every 3 days she will apply antibiotic ointment to the area.  She is very pleased at this point  PCP is Ronnald Nian, MD.  No Known Allergies  Objective:  There were no vitals filed for this visit.  Vascular Examination: Capillary refill time is 3-5 seconds to toes bilateral. Palpable pedal pulses b/l LE. Digital hair present b/l. No pedal edema b/l. Skin temperature gradient WNL b/l. No varicosities b/l. No cyanosis or clubbing noted b/l.   Dermatological Examination: Upon inspection of the PNA site, there are no clinical signs of infection.  No purulence, no necrosis, no malodor present.  Minimal to no erythema present due to phenol chemical reaction.  Eschar formed along nail margin.  Minimal to no pain on palpation of area.   Assessment/Plan: Left hallux medial nail border PNA.  No signs of infection.  Healing well.  Patient may discontinue soaks and use of antibiotic ointment, but it is not necessary that she has to as it will not be healing at this point in time since everything is stable.   Return if symptoms worsen or fail to improve, for she already has her RFC appt with Dr. Ralene Cork she said.   Clerance Lav, DPM, FACFAS [facility]     [address] [place], [place] [address]                Office [phone]  Fax [phone]

## 2023-04-06 DIAGNOSIS — H43812 Vitreous degeneration, left eye: Secondary | ICD-10-CM | POA: Diagnosis not present

## 2023-04-10 ENCOUNTER — Encounter: Payer: Self-pay | Admitting: Family Medicine

## 2023-04-10 ENCOUNTER — Ambulatory Visit (INDEPENDENT_AMBULATORY_CARE_PROVIDER_SITE_OTHER): Payer: Medicare Other | Admitting: Family Medicine

## 2023-04-10 VITALS — BP 136/80 | HR 77 | Temp 98.2°F | Resp 16 | Wt 182.6 lb

## 2023-04-10 DIAGNOSIS — R140 Abdominal distension (gaseous): Secondary | ICD-10-CM

## 2023-04-10 NOTE — Progress Notes (Signed)
   Subjective:    Patient ID: Kristina Maldonado, female    DOB: 1940/10/26, 82 y.o.   MRN: [id number]  HPI She states that since the end of May she has noted bloating, eructation and flatus as well as softer BMs.  She notes the symptoms usually occur 15 to 20 minutes after usually her morning meal and she does not notice it after any other meals.  She does eat sausage as well as bacon and also has milk and milk products during that same timeframe.  No nausea, vomiting or abdominal pain.   Review of Systems     Objective:   Physical Exam Alert and in no distress cardiac exam shows regular rhythm without murmurs or gallops.  Lungs clear to auscultation.  Abdominal exam shows active bowel sounds without masses or tenderness.  Negative Murphy sign and Murphy's punch.       Assessment & Plan:  Abdominal bloating Discussed the fact that this could be gallbladder related but possibly more so lactose intolerant.  Recommend she try Lactaid but also pay attention to see if greasy foods versus milk and milk products play a role.  Discussed holding off on the bacon and sausage in the morning and trying the Lactaid to see if this will help.  She will keep me informed.

## 2023-04-10 NOTE — Patient Instructions (Signed)
Try Lactaid before you eat and see if that helps with your symptoms and also pay attention to see if milk or milk products play a role or even greasy foods.  Let me know

## 2023-04-18 ENCOUNTER — Ambulatory Visit
Admission: RE | Admit: 2023-04-18 | Discharge: 2023-04-18 | Disposition: A | Discharge status: Home / Self Care | Payer: Medicare Other | Source: Ambulatory Visit | Attending: Family Medicine | Admitting: Family Medicine

## 2023-04-18 DIAGNOSIS — Z1231 Encounter for screening mammogram for malignant neoplasm of breast: Secondary | ICD-10-CM

## 2023-05-15 ENCOUNTER — Other Ambulatory Visit: Payer: Self-pay | Admitting: Family Medicine

## 2023-05-15 DIAGNOSIS — I10 Essential (primary) hypertension: Secondary | ICD-10-CM

## 2023-05-16 ENCOUNTER — Ambulatory Visit: Payer: Medicare Other | Admitting: Podiatry

## 2023-05-16 ENCOUNTER — Encounter: Payer: Self-pay | Admitting: Podiatry

## 2023-05-16 DIAGNOSIS — M79675 Pain in left toe(s): Secondary | ICD-10-CM | POA: Diagnosis not present

## 2023-05-16 DIAGNOSIS — L84 Corns and callosities: Secondary | ICD-10-CM

## 2023-05-16 DIAGNOSIS — M79674 Pain in right toe(s): Secondary | ICD-10-CM | POA: Diagnosis not present

## 2023-05-16 DIAGNOSIS — B351 Tinea unguium: Secondary | ICD-10-CM

## 2023-05-22 NOTE — Progress Notes (Signed)
  Subjective:  Patient ID: Kristina Maldonado, female    DOB: 1941/03/31,  MRN: [id number]  Kristina Maldonado presents to clinic today for corn(s) left fifth digit and painful thick toenails that are difficult to trim. Painful toenails interfere with ambulation. Aggravating factors include wearing enclosed shoe gear. Pain is relieved with periodic professional debridement. Painful corns are aggravated when weightbearing when wearing enclosed shoe gear. Pain is relieved with periodic professional debridement.  Chief Complaint  Patient presents with   NAIL CARE    RFC   New problem(s): None.   PCP is Ronnald Nian, MD.  No Known Allergies  Review of Systems: Negative except as noted in the HPI.  Objective: No changes noted in today's physical examination. There were no vitals filed for this visit. Kristina Maldonado is a pleasant 82 y.o. female WD, WN in NAD. AAO x 3.  Vascular Examination: CFT <3 seconds b/l. DP pulses faintly palpable b/l. PT pulses nonpalpable b/l. Digital hair absent. Skin temperature gradient warm to warm b/l. No pain with calf compression. No ischemia or gangrene. No cyanosis or clubbing noted b/l. Trace edema noted BLE.   Neurological Examination: Sensation grossly intact b/l with 10 gram monofilament. Vibratory sensation intact b/l.   Dermatological Examination: Pedal skin warm and supple b/l. No open wounds b/l. No interdigital macerations. Toenails 1-5 b/l thick, discolored, elongated with subungual debris and pain on dorsal palpation.  Hyperkeratotic lesion(s) left fifth digit.  No erythema, no edema, no drainage, no fluctuance.  Musculoskeletal Examination: Normal muscle strength 5/5 to all lower extremity muscle groups bilaterally. Hammertoe(s) noted to the left fifth digit and right fifth digit.Marland Kitchen No pain, crepitus or joint limitation noted with ROM b/l LE.  Patient ambulates independently without assistive aids.  Radiographs: None  Assessment/Plan: 1. Pain  due to onychomycosis of toenails of both feet   2. Corn left 5th digit   3. Pain in left toe(s)    -Patient was evaluated and treated. All patient's and/or POA's questions/concerns answered on today's visit. -Patient to continue soft, supportive shoe gear daily. -Continue supportive shoe gear daily. -Mycotic toenails 1-5 bilaterally were debrided in length and girth with sterile nail nippers and dremel without incident. -Corn(s) left fifth digit pared utilizing sterile scalpel blade without complication or incident. Total number debrided=1. -Patient/POA to call should there be question/concern in the interim.   Return in about 3 months (around 08/16/2023).  Freddie Breech, DPM

## 2023-06-14 DIAGNOSIS — H43812 Vitreous degeneration, left eye: Secondary | ICD-10-CM | POA: Diagnosis not present

## 2023-06-28 ENCOUNTER — Other Ambulatory Visit: Payer: Self-pay | Admitting: Family Medicine

## 2023-06-28 DIAGNOSIS — E785 Hyperlipidemia, unspecified: Secondary | ICD-10-CM

## 2023-06-28 DIAGNOSIS — I10 Essential (primary) hypertension: Secondary | ICD-10-CM

## 2023-07-12 ENCOUNTER — Other Ambulatory Visit (INDEPENDENT_AMBULATORY_CARE_PROVIDER_SITE_OTHER): Payer: Medicare Other

## 2023-07-12 DIAGNOSIS — Z23 Encounter for immunization: Secondary | ICD-10-CM | POA: Diagnosis not present

## 2023-08-29 ENCOUNTER — Ambulatory Visit: Payer: Medicare Other | Admitting: Podiatry

## 2023-08-29 ENCOUNTER — Encounter: Payer: Self-pay | Admitting: Podiatry

## 2023-08-29 DIAGNOSIS — B351 Tinea unguium: Secondary | ICD-10-CM | POA: Diagnosis not present

## 2023-08-29 DIAGNOSIS — L84 Corns and callosities: Secondary | ICD-10-CM | POA: Diagnosis not present

## 2023-08-29 DIAGNOSIS — M79675 Pain in left toe(s): Secondary | ICD-10-CM | POA: Diagnosis not present

## 2023-08-29 DIAGNOSIS — M79674 Pain in right toe(s): Secondary | ICD-10-CM | POA: Diagnosis not present

## 2023-08-29 NOTE — Progress Notes (Signed)
  Subjective:  Patient ID: Kristina Maldonado, female    DOB: 13-Oct-1941,  MRN: [id number]  82 y.o. female presents corn(s) left lower extremity and painful thick toenails that are difficult to trim. Painful toenails interfere with ambulation. Aggravating factors include wearing enclosed shoe gear. Pain is relieved with periodic professional debridement. Painful corns are aggravated when weightbearing when wearing enclosed shoe gear. Pain is relieved with periodic professional debridement.  Chief Complaint  Patient presents with   RFC    RFC   New problem(s): None   PCP is Ronnald Nian, MD.  No Known Allergies  Review of Systems: Negative except as noted in the HPI.   Objective:  Kristina Maldonado is a pleasant 82 y.o. female in NAD. AAO x 3.  Vascular Examination: Vascular status intact b/l with palpable pedal pulses. CFT immediate b/l. Pedal hair present. No edema. No pain with calf compression b/l. Skin temperature gradient WNL b/l. No varicosities noted. No cyanosis or clubbing noted.  Neurological Examination: Sensation grossly intact b/l with 10 gram monofilament. Vibratory sensation intact b/l.  Dermatological Examination: Pedal skin with normal turgor, texture and tone b/l. No open wounds nor interdigital macerations noted. Toenails 1-5 b/l thick, discolored, elongated with subungual debris and pain on dorsal palpation. Hyperkeratotic lesion(s) left fifth digit.  No erythema, no edema, no drainage, no fluctuance.  Musculoskeletal Examination: Normal muscle strength 5/5 to all lower extremity muscle groups bilaterally. Hammertoe(s) noted to the left fifth digit and right fifth digit.Marland Kitchen No pain, crepitus or joint limitation noted with ROM b/l LE.  Patient ambulates independently without assistive aids.  Radiographs: None   Assessment:   1. Pain due to onychomycosis of toenails of both feet   2. Corn left 5th digit   3. Pain in left toe(s)    Plan:  -Consent given for  treatment as described below: -Examined patient. -Toenails 1-5 b/l were debrided in length and girth with sterile nail nippers and dremel without iatrogenic bleeding.  -Corn(s) left fifth digit pared utilizing sterile scalpel blade without complication or incident. Total number debrided=1. -Patient/POA to call should there be question/concern in the interim.  Return in about 3 months (around 11/29/2023).  Freddie Breech, DPM

## 2023-09-03 ENCOUNTER — Telehealth: Payer: Self-pay | Admitting: Family Medicine

## 2023-09-03 NOTE — Telephone Encounter (Signed)
Pt needs to schedule an appt for vertigo episodes and right foot problems (unable to lay flat and is compensating by turning foot outward)

## 2023-09-03 NOTE — Telephone Encounter (Signed)
Dotties daugher Pam would like you to call her [phone] and she is on the dpr.

## 2023-09-05 ENCOUNTER — Ambulatory Visit (INDEPENDENT_AMBULATORY_CARE_PROVIDER_SITE_OTHER): Payer: Medicare Other | Admitting: Medical

## 2023-09-05 VITALS — BP 110/64 | HR 68 | Temp 97.7°F | Wt 183.8 lb

## 2023-09-05 DIAGNOSIS — R269 Unspecified abnormalities of gait and mobility: Secondary | ICD-10-CM | POA: Diagnosis not present

## 2023-09-05 DIAGNOSIS — M2141 Flat foot [pes planus] (acquired), right foot: Secondary | ICD-10-CM

## 2023-09-05 DIAGNOSIS — M21961 Unspecified acquired deformity of right lower leg: Secondary | ICD-10-CM

## 2023-09-05 DIAGNOSIS — R2689 Other abnormalities of gait and mobility: Secondary | ICD-10-CM

## 2023-09-05 DIAGNOSIS — M2142 Flat foot [pes planus] (acquired), left foot: Secondary | ICD-10-CM

## 2023-09-05 NOTE — Progress Notes (Signed)
Subjective:  Kristina Maldonado is a 82 y.o. female who presents for Chief Complaint  Patient presents with   balance issue    Balance issue with right foot pattern. She was pronating and her foot was rolling over per daughter Kristina Maldonado on the phone . Pt doesn't think it is related to vertigo and she has had vertigo before and its doesn't feel like it. Its an issues with her foot.  Daughter said she used to go to PT for vertigo and was released from that and wanted to make sure she didn't need to go back. Pt is back in the Gym a couple days a week to work on balance     Here for concerns.   Her daughter Kristina Maldonado on the phone is concerned about her balance, particularly in relation to her right foot.   She had some possible vertigo concerns prior but Kristina Maldonado doesn't think vertigo is a problem currently.   Daughter feels like her gait and walk is overpronating and causing some problems .   No current pain.  Foot does drag on floor.  Doesn't use cane or walker.   Sometimes big toe is sometime numb.   No weakness.   No recent falls.   Drives.   Exercising - yard work, exercise around the house.    At 1 point she was fitted for shoes at [employer] and certain shoes that were designed for over pronation she does well with.  She notes when she is wearing flip-flops or other shoes that is when she has problems.  She does like wearing some of those other shoes.  No other aggravating or relieving factors.    No other c/o.  Past Medical History:  Diagnosis Date   Allergy    Dyslipidemia    Hyperlipidemia    Hypertension    Obesity    Osteopenia    Current Outpatient Medications on File Prior to Visit  Medication Sig Dispense Refill   Alpha-D-Galactosidase (BEANO) TABS Take 1 tablet by mouth daily.     amLODipine (NORVASC) 10 MG tablet TAKE 1 TABLET BY MOUTH DAILY 100 tablet 2   Ascorbic Acid (VITAMIN C) 100 MG tablet Take 100 mg by mouth daily.     aspirin 81 MG tablet Take 81 mg by mouth daily.      atorvastatin (LIPITOR) 40 MG tablet TAKE 1 TABLET BY MOUTH DAILY 100 tablet 2   BIOTIN PO Take 500 mcg by mouth.     Calcium-Magnesium-Vitamin D (CALCIUM 1200+D3 PO) Take 600 mg by mouth once.     Cholecalciferol (VITAMIN D3) 25 MCG (1000 UT) capsule Take 1,000 Units by mouth daily.     ibuprofen (ADVIL) 200 MG tablet Take 200 mg by mouth every 6 (six) hours as needed.     losartan-hydrochlorothiazide (HYZAAR) 50-12.5 MG tablet TAKE 1 TABLET BY MOUTH DAILY 100 tablet 2   mirabegron ER (MYRBETRIQ) 25 MG TB24 tablet Take 1 tablet (25 mg total) by mouth daily. 90 tablet 3   Multiple Vitamins-Minerals (MULTIVITAMIN WITH MINERALS) tablet Take 1 tablet by mouth daily. Reported on 03/22/2016     Omega-3 1000 MG CAPS Take by mouth.     Potassium 99 MG TABS Take 99 mg by mouth once.     Probiotic Product (PROBIOTIC DAILY PO) Take 1 capsule by mouth daily.     Cholecalciferol (VITAMIN D3) 3000 units TABS Take 4,000 Units by mouth daily.     diclofenac Sodium (VOLTAREN) 1 % GEL Apply topically  4 (four) times daily.     Docusate Sodium 100 MG capsule Take 100 mg by mouth 2 (two) times daily. (Patient not taking: Reported on 09/05/2023)     triamcinolone cream (KENALOG) 0.1 % Apply 1 application topically 2 (two) times daily. (Patient not taking: Reported on 09/05/2023) 45 g 0   No current facility-administered medications on file prior to visit.     The following portions of the patient's history were reviewed and updated as appropriate: allergies, current medications, past family history, past medical history, past social history, past surgical history and problem list.  ROS Otherwise as in subjective above  Objective: BP 110/64   Pulse 68   Temp 97.7 F (36.5 C)   Wt 183 lb 12.8 oz (83.4 kg)   SpO2 98%   BMI 30.59 kg/m   Wt Readings from Last 3 Encounters:  09/05/23 183 lb 12.8 oz (83.4 kg)  04/10/23 182 lb 9.6 oz (82.8 kg)  02/15/23 189 lb 9.6 oz (86 kg)   BP Readings from Last 3  Encounters:  09/05/23 110/64  04/10/23 136/80  02/15/23 130/78    General appearance: alert, no distress, well developed, well nourished Bilateral feet relatively flat arches, thickened toenails in general particular the great toenails, right foot does over pronate when walking causing somewhat of a shuffle gait, otherwise foot nontender without obvious swelling or deformity Legs nontender and relatively good range of motion Pulses 1+ pedal pulses, cap refill normal Foot and leg sensation and strength normal No extremity edema   Assessment: Encounter Diagnoses  Name Primary?   Deformity of right foot Yes   Flat feet    Gait abnormality    Balance problem      Plan: Advise she wear the Brooks shoes that she was fitted for that work well with her.  Referral for physical therapy for balance concerns and other strategies to help with her foot abnormality and gait.  Advise she also discussed her foot issues when she goes back to see her podiatrist in January  [name] was seen today for balance issue.  Diagnoses and all orders for this visit:  Deformity of right foot -     Ambulatory referral to Physical Therapy  Flat feet -     Ambulatory referral to Physical Therapy  Gait abnormality -     Ambulatory referral to Physical Therapy  Balance problem -     Ambulatory referral to Physical Therapy    Follow up: with PT

## 2023-09-12 DIAGNOSIS — M6281 Muscle weakness (generalized): Secondary | ICD-10-CM | POA: Diagnosis not present

## 2023-09-12 DIAGNOSIS — R262 Difficulty in walking, not elsewhere classified: Secondary | ICD-10-CM | POA: Diagnosis not present

## 2023-09-18 DIAGNOSIS — M6281 Muscle weakness (generalized): Secondary | ICD-10-CM | POA: Diagnosis not present

## 2023-09-18 DIAGNOSIS — R262 Difficulty in walking, not elsewhere classified: Secondary | ICD-10-CM | POA: Diagnosis not present

## 2023-09-28 DIAGNOSIS — R262 Difficulty in walking, not elsewhere classified: Secondary | ICD-10-CM | POA: Diagnosis not present

## 2023-09-28 DIAGNOSIS — M6281 Muscle weakness (generalized): Secondary | ICD-10-CM | POA: Diagnosis not present

## 2023-10-01 DIAGNOSIS — M6281 Muscle weakness (generalized): Secondary | ICD-10-CM | POA: Diagnosis not present

## 2023-10-01 DIAGNOSIS — R262 Difficulty in walking, not elsewhere classified: Secondary | ICD-10-CM | POA: Diagnosis not present

## 2023-10-04 DIAGNOSIS — R262 Difficulty in walking, not elsewhere classified: Secondary | ICD-10-CM | POA: Diagnosis not present

## 2023-10-04 DIAGNOSIS — M6281 Muscle weakness (generalized): Secondary | ICD-10-CM | POA: Diagnosis not present

## 2023-10-08 DIAGNOSIS — M6281 Muscle weakness (generalized): Secondary | ICD-10-CM | POA: Diagnosis not present

## 2023-10-08 DIAGNOSIS — R262 Difficulty in walking, not elsewhere classified: Secondary | ICD-10-CM | POA: Diagnosis not present

## 2023-10-15 DIAGNOSIS — R262 Difficulty in walking, not elsewhere classified: Secondary | ICD-10-CM | POA: Diagnosis not present

## 2023-10-15 DIAGNOSIS — M6281 Muscle weakness (generalized): Secondary | ICD-10-CM | POA: Diagnosis not present

## 2023-10-19 DIAGNOSIS — R262 Difficulty in walking, not elsewhere classified: Secondary | ICD-10-CM | POA: Diagnosis not present

## 2023-10-19 DIAGNOSIS — M6281 Muscle weakness (generalized): Secondary | ICD-10-CM | POA: Diagnosis not present

## 2023-10-22 DIAGNOSIS — M6281 Muscle weakness (generalized): Secondary | ICD-10-CM | POA: Diagnosis not present

## 2023-10-22 DIAGNOSIS — R262 Difficulty in walking, not elsewhere classified: Secondary | ICD-10-CM | POA: Diagnosis not present

## 2023-10-26 DIAGNOSIS — R262 Difficulty in walking, not elsewhere classified: Secondary | ICD-10-CM | POA: Diagnosis not present

## 2023-10-26 DIAGNOSIS — M6281 Muscle weakness (generalized): Secondary | ICD-10-CM | POA: Diagnosis not present

## 2023-12-18 ENCOUNTER — Encounter: Payer: Self-pay | Admitting: Internal Medicine

## 2023-12-19 ENCOUNTER — Ambulatory Visit: Payer: Medicare Other | Admitting: Podiatry

## 2023-12-19 ENCOUNTER — Encounter: Payer: Self-pay | Admitting: Podiatry

## 2023-12-19 VITALS — Ht 65.0 in | Wt 183.0 lb

## 2023-12-19 DIAGNOSIS — M79675 Pain in left toe(s): Secondary | ICD-10-CM

## 2023-12-19 DIAGNOSIS — L84 Corns and callosities: Secondary | ICD-10-CM

## 2023-12-19 DIAGNOSIS — M79674 Pain in right toe(s): Secondary | ICD-10-CM | POA: Diagnosis not present

## 2023-12-19 DIAGNOSIS — B351 Tinea unguium: Secondary | ICD-10-CM | POA: Diagnosis not present

## 2023-12-19 NOTE — Progress Notes (Unsigned)
  Subjective:  Patient ID: Kristina Maldonado, female    DOB: 1941-05-24,  MRN: [id number]  Kristina Maldonado presents to clinic today for {jgcomplaint:23593}  Chief Complaint  Patient presents with   [facility]    She is here for a nail trim, PCP is Dr. Susann Givens and "my physical is coming up in may"   New problem(s): None. {jgcomplaint:23593}  PCP is Ronnald Nian, MD.  No Known Allergies  Review of Systems: Negative except as noted in the HPI.  Objective: No changes noted in today's physical examination. There were no vitals filed for this visit. GEORGENIA SALIM is a pleasant 83 y.o. female {jgbodyhabitus:24098} AAO x 3.  Vascular Examination: Vascular status intact b/l with palpable pedal pulses. CFT immediate b/l. Pedal hair present. No edema. No pain with calf compression b/l. Skin temperature gradient WNL b/l. No varicosities noted. No cyanosis or clubbing noted.  Neurological Examination: Sensation grossly intact b/l with 10 gram monofilament. Vibratory sensation intact b/l.  Dermatological Examination: Pedal skin with normal turgor, texture and tone b/l. No open wounds nor interdigital macerations noted. Toenails 1-5 b/l thick, discolored, elongated with subungual debris and pain on dorsal palpation. Hyperkeratotic lesion(s) left fifth digit.  No erythema, no edema, no drainage, no fluctuance.  Musculoskeletal Examination: Normal muscle strength 5/5 to all lower extremity muscle groups bilaterally. Hammertoe(s) noted to the left fifth digit and right fifth digit.Marland Kitchen No pain, crepitus or joint limitation noted with ROM b/l LE.  Patient ambulates independently without assistive aids.  Radiographs: None  Assessment/Plan: 1. Pain due to onychomycosis of toenails of both feet   2. Corn left 5th digit   3. Pain in left toe(s)     {Jgplan:23602::"-Patient/POA to call should there be question/concern in the interim."}   No follow-ups on file.  Freddie Breech, DPM      [facility]  LOCATION: [address] [place] [address]                   Office [phone]   [facility] LOCATION: [address] [place], [place] [address] Office [phone]

## 2023-12-21 ENCOUNTER — Encounter: Payer: Self-pay | Admitting: Podiatry

## 2023-12-21 DIAGNOSIS — H25813 Combined forms of age-related cataract, bilateral: Secondary | ICD-10-CM | POA: Diagnosis not present

## 2023-12-21 DIAGNOSIS — R7309 Other abnormal glucose: Secondary | ICD-10-CM | POA: Diagnosis not present

## 2023-12-21 DIAGNOSIS — H524 Presbyopia: Secondary | ICD-10-CM | POA: Diagnosis not present

## 2023-12-21 DIAGNOSIS — H40023 Open angle with borderline findings, high risk, bilateral: Secondary | ICD-10-CM | POA: Diagnosis not present

## 2024-01-03 ENCOUNTER — Ambulatory Visit (INDEPENDENT_AMBULATORY_CARE_PROVIDER_SITE_OTHER): Admitting: Medical

## 2024-01-03 VITALS — BP 110/70 | HR 77 | Wt 181.8 lb

## 2024-01-03 DIAGNOSIS — K5900 Constipation, unspecified: Secondary | ICD-10-CM

## 2024-01-03 DIAGNOSIS — N3946 Mixed incontinence: Secondary | ICD-10-CM | POA: Diagnosis not present

## 2024-01-03 DIAGNOSIS — K4090 Unilateral inguinal hernia, without obstruction or gangrene, not specified as recurrent: Secondary | ICD-10-CM | POA: Diagnosis not present

## 2024-01-03 NOTE — Patient Instructions (Signed)
 Urine incontinence Drink at least 60 ounces of water a day but preferably closer to 80 ounces of water a day You can use your Myrbetriq bladder pill daily.  You are currently not doing this daily.  This is to help with frequency of urination and incontinence. Referral to urology today for further evaluation and discussion of incontinence and other treatment options.  I recommend you talk to them about pelvic floor physical therapy as they have a therapist that I can help you as well   Constipation Hydrate throughout the day with water Consider fiber supplement if you are not doing that currently.  Examples include FiberCon or Metamucil daily You can continue MiraLAX powder 1 scoop daily to help with constipation or you can use your docusate Colace stool softener as needed Eat a good variety of greens and grains, apples and pears, you can continue prunes as well   Inguinal hernia, left Currently your hernia does not seem to be tender or bothering you a whole lot Hernia can get bigger over time or can even become an emergency if intestines is trapped outside the opening in the muscle wall You think you may have had this for the past year without getting a lot of problem Thus, you can continue with a watch and wait approach If the hernia is getting bigger or more tender or if you get ready to have this looked at for treatment I can refer you to general surgery

## 2024-01-03 NOTE — Progress Notes (Signed)
 Subjective:  Kristina Maldonado is a 83 y.o. female who presents for Chief Complaint  Patient presents with   Hernia    Possible hernia on left side, no pain. Not sure if it from bearing down when being constipated.      Here for concern about a hernia.  She thinks the past year she has had a bulge in the left lower abdomen.  She feels something that she does not feel in the other side.  Not particular tender and it does not bother her a whole lot.  She has some chronic constipation.  She does use prunes, MiraLAX, and drinks a decent amount of water daily.  She also has concerns about urinary incontinence.  This is fairly frequent.  She wears pads at bedtime and she gets up more than 1 time to urinate nightly.  She uses Myrbetriq some, but not every day.  She would like to see a specialist for this.  No other aggravating or relieving factors.    No other c/o.  Past Medical History:  Diagnosis Date   Allergy    Dyslipidemia    Hyperlipidemia    Hypertension    Obesity    Osteopenia    Current Outpatient Medications on File Prior to Visit  Medication Sig Dispense Refill   amLODipine (NORVASC) 10 MG tablet TAKE 1 TABLET BY MOUTH DAILY 100 tablet 2   Ascorbic Acid (VITAMIN C) 100 MG tablet Take 100 mg by mouth daily.     aspirin 81 MG tablet Take 81 mg by mouth daily.     atorvastatin (LIPITOR) 40 MG tablet TAKE 1 TABLET BY MOUTH DAILY 100 tablet 2   BIOTIN PO Take 500 mcg by mouth.     Calcium-Magnesium-Vitamin D (CALCIUM 1200+D3 PO) Take 600 mg by mouth once.     Cholecalciferol (VITAMIN D3) 3000 units TABS Take 4,000 Units by mouth daily.     diclofenac Sodium (VOLTAREN) 1 % GEL Apply topically 4 (four) times daily.     Docusate Sodium 100 MG capsule Take 100 mg by mouth 2 (two) times daily.     losartan-hydrochlorothiazide (HYZAAR) 50-12.5 MG tablet TAKE 1 TABLET BY MOUTH DAILY 100 tablet 2   mirabegron ER (MYRBETRIQ) 25 MG TB24 tablet Take 1 tablet (25 mg total) by mouth daily.  90 tablet 3   Omega-3 1000 MG CAPS Take by mouth.     Potassium 99 MG TABS Take 99 mg by mouth once.     Probiotic Product (PROBIOTIC DAILY PO) Take 1 capsule by mouth daily.     triamcinolone cream (KENALOG) 0.1 % Apply 1 application topically 2 (two) times daily. 45 g 0   Alpha-D-Galactosidase (BEANO) TABS Take 1 tablet by mouth daily.     ibuprofen (ADVIL) 200 MG tablet Take 200 mg by mouth every 6 (six) hours as needed.     Multiple Vitamins-Minerals (MULTIVITAMIN WITH MINERALS) tablet Take 1 tablet by mouth daily. Reported on 03/22/2016     No current facility-administered medications on file prior to visit.     The following portions of the patient's history were reviewed and updated as appropriate: allergies, current medications, past family history, past medical history, past social history, past surgical history and problem list.  ROS Otherwise as in subjective above  Objective: BP 110/70   Pulse 77   Wt 181 lb 12.8 oz (82.5 kg)   BMI 30.25 kg/m   General appearance: alert, no distress, well developed, well nourished Abdomen: +bs, soft, non  tender, non distended, no masses, no hepatomegaly, no splenomegaly Left inguinal region with a soft tissue bulge that is reducible consistent with fairly small inguinal hernia.  Not particular tender.  Exam chaperoned by nurse.    Assessment: Encounter Diagnoses  Name Primary?   Mixed stress and urge urinary incontinence Yes   Constipation, unspecified constipation type    Non-recurrent unilateral inguinal hernia without obstruction or gangrene      Plan:  Urine incontinence Drink at least 60 ounces of water a day but preferably closer to 80 ounces of water a day You can use your Myrbetriq bladder pill daily.  You are currently not doing this daily.  This is to help with frequency of urination and incontinence. Referral to urology today for further evaluation and discussion of incontinence and other treatment options.  I  recommend you talk to them about pelvic floor physical therapy as they have a therapist that I can help you as well   Constipation Hydrate throughout the day with water Consider fiber supplement if you are not doing that currently.  Examples include FiberCon or Metamucil daily You can continue MiraLAX powder 1 scoop daily to help with constipation or you can use your docusate Colace stool softener as needed Eat a good variety of greens and grains, apples and pears, you can continue prunes as well   Inguinal hernia, left Currently your hernia does not seem to be tender or bothering you a whole lot Hernia can get bigger over time or can even become an emergency if intestines is trapped outside the opening in the muscle wall You think you may have had this for the past year without getting a lot of problem Thus, you can continue with a watch and wait approach If the hernia is getting bigger or more tender or if you get ready to have this looked at for treatment I can refer you to general surgery   Miasha was seen today for hernia.  Diagnoses and all orders for this visit:  Mixed stress and urge urinary incontinence -     Ambulatory referral to Urology  Constipation, unspecified constipation type  Non-recurrent unilateral inguinal hernia without obstruction or gangrene    Follow up: pending urology consult

## 2024-01-10 ENCOUNTER — Encounter: Payer: Self-pay | Admitting: Urology

## 2024-01-10 ENCOUNTER — Ambulatory Visit: Admitting: Urology

## 2024-01-10 VITALS — BP 144/89 | HR 76 | Ht 65.0 in | Wt 180.0 lb

## 2024-01-10 DIAGNOSIS — N3941 Urge incontinence: Secondary | ICD-10-CM | POA: Diagnosis not present

## 2024-01-10 DIAGNOSIS — N3281 Overactive bladder: Secondary | ICD-10-CM | POA: Diagnosis not present

## 2024-01-10 LAB — URINALYSIS, ROUTINE W REFLEX MICROSCOPIC
Bilirubin, UA: NEGATIVE
Glucose, UA: NEGATIVE
Ketones, UA: NEGATIVE
Nitrite, UA: NEGATIVE
Protein,UA: NEGATIVE
RBC, UA: NEGATIVE
Specific Gravity, UA: 1.01 (ref 1.005–1.030)
Urobilinogen, Ur: 0.2 mg/dL (ref 0.2–1.0)
pH, UA: 5.5 (ref 5.0–7.5)

## 2024-01-10 LAB — MICROSCOPIC EXAMINATION

## 2024-01-10 NOTE — Progress Notes (Signed)
 Assessment: 1. OAB (overactive bladder)   2. Urge incontinence      Plan: Today had a long discussion with the patient and her daughter regarding her lower urinary tract symptoms. We discussed behavorial techniques and proper fluid intake. Avoid bladder irritants Take myrbetriq FU as needed.  Do not think pelvic floor PT indicated at this time (has no sui)  Chief Complaint: luts  History of Present Illness:  Kristina Maldonado is a 83 y.o. female who is seen in consultation from Ronnald Nian, MD for evaluation of LUTS/incontinence.  Patient reports several year history of worsening symptoms most consistent with OAB with urinary frequency urgency and occasional urge incontinence.  She does not typically wear pads except when she goes out and to church.  She has occasional small-volume leakage with urgency.  She denies any stress incontinence.  She has been prescribed Myrbetriq but admits to not taking it on any kind of regular basis.  She denies any hematuria flank pain or other GU complaints.   Past Medical History:  Past Medical History:  Diagnosis Date   Allergy    Dyslipidemia    Hyperlipidemia    Hypertension    Obesity    Osteopenia     Past Surgical History:  Past Surgical History:  Procedure Laterality Date   ABDOMINAL HYSTERECTOMY     COLONOSCOPY  2009   PERRY   Hammertoe surgery 5th toe Right     Allergies:  No Known Allergies  Family History:  Family History  Problem Relation Age of Onset   CVA Mother    Hypertension Mother    Hypertension Father    Breast cancer Sister     Social History:  Social History   Tobacco Use   Smoking status: Never   Smokeless tobacco: Never  Substance Use Topics   Alcohol use: No   Drug use: No    Review of symptoms:  Constitutional:  Negative for unexplained weight loss, night sweats, fever, chills ENT:  Negative for nose bleeds, sinus pain, painful swallowing CV:  Negative for chest pain, shortness of  breath, exercise intolerance, palpitations, loss of consciousness Resp:  Negative for cough, wheezing, shortness of breath GI:  Negative for nausea, vomiting, diarrhea, bloody stools GU:  Positives noted in HPI; otherwise negative for gross hematuria, dysuria, urinary incontinence Neuro:  Negative for seizures, poor balance, limb weakness, slurred speech Psych:  Negative for lack of energy, depression, anxiety Endocrine:  Negative for polydipsia, polyuria, symptoms of hypoglycemia (dizziness, hunger, sweating) Hematologic:  Negative for anemia, purpura, petechia, prolonged or excessive bleeding, use of anticoagulants  Allergic:  Negative for difficulty breathing or choking as a result of exposure to anything; no shellfish allergy; no allergic response (rash/itch) to materials, foods  Physical exam: BP (!) 144/89   Pulse 76   Ht 5\' 5"  (1.651 m)   Wt 180 lb (81.6 kg)   BMI 29.95 kg/m  GENERAL APPEARANCE:  Well appearing, well developed, well nourished, NAD   Results: Results for orders placed or performed in visit on 01/10/24 (from the past 24 hours)  Microscopic Examination   Collection Time: 01/10/24 12:00 AM   Urine  Result Value Ref Range   WBC, UA 6-10 (A) 0 - 5 /hpf   RBC, Urine 0-2 0 - 2 /hpf   Epithelial Cells (non renal) 0-10 0 - 10 /hpf   Bacteria, UA Moderate (A) None seen/Few  Urinalysis, Routine w reflex microscopic   Collection Time: 01/10/24 12:00 AM  Result Value Ref Range   Specific Gravity, UA 1.010 1.005 - 1.030   pH, UA 5.5 5.0 - 7.5   Color, UA Yellow Yellow   Appearance Ur Clear Clear   Leukocytes,UA Trace (A) Negative   Protein,UA Negative Negative/Trace   Glucose, UA Negative Negative   Ketones, UA Negative Negative   RBC, UA Negative Negative   Bilirubin, UA Negative Negative   Urobilinogen, Ur 0.2 0.2 - 1.0 mg/dL   Nitrite, UA Negative Negative   Microscopic Examination See below:

## 2024-02-27 ENCOUNTER — Other Ambulatory Visit: Payer: Self-pay | Admitting: Family Medicine

## 2024-02-27 DIAGNOSIS — N3281 Overactive bladder: Secondary | ICD-10-CM

## 2024-02-28 ENCOUNTER — Ambulatory Visit (INDEPENDENT_AMBULATORY_CARE_PROVIDER_SITE_OTHER): Payer: Medicare Other | Admitting: Family Medicine

## 2024-02-28 VITALS — BP 116/82 | HR 77 | Ht 64.0 in | Wt 177.4 lb

## 2024-02-28 DIAGNOSIS — M8580 Other specified disorders of bone density and structure, unspecified site: Secondary | ICD-10-CM | POA: Diagnosis not present

## 2024-02-28 DIAGNOSIS — J301 Allergic rhinitis due to pollen: Secondary | ICD-10-CM

## 2024-02-28 DIAGNOSIS — E785 Hyperlipidemia, unspecified: Secondary | ICD-10-CM

## 2024-02-28 DIAGNOSIS — N3281 Overactive bladder: Secondary | ICD-10-CM | POA: Diagnosis not present

## 2024-02-28 DIAGNOSIS — I10 Essential (primary) hypertension: Secondary | ICD-10-CM

## 2024-02-28 DIAGNOSIS — E782 Mixed hyperlipidemia: Secondary | ICD-10-CM

## 2024-02-28 DIAGNOSIS — Z780 Asymptomatic menopausal state: Secondary | ICD-10-CM

## 2024-02-28 DIAGNOSIS — E6609 Other obesity due to excess calories: Secondary | ICD-10-CM

## 2024-02-28 DIAGNOSIS — H25093 Other age-related incipient cataract, bilateral: Secondary | ICD-10-CM

## 2024-02-28 DIAGNOSIS — Z0000 Encounter for general adult medical examination without abnormal findings: Secondary | ICD-10-CM | POA: Diagnosis not present

## 2024-02-28 DIAGNOSIS — Z23 Encounter for immunization: Secondary | ICD-10-CM

## 2024-02-28 LAB — LIPID PANEL

## 2024-02-28 MED ORDER — ATORVASTATIN CALCIUM 40 MG PO TABS
40.0000 mg | ORAL_TABLET | Freq: Every day | ORAL | 3 refills | Status: DC
Start: 2024-02-28 — End: 2024-05-19

## 2024-02-28 MED ORDER — LOSARTAN POTASSIUM-HCTZ 50-12.5 MG PO TABS: 1.0000 | ORAL_TABLET | Freq: Every day | ORAL | 3 refills | Status: AC

## 2024-02-28 MED ORDER — AMLODIPINE BESYLATE 10 MG PO TABS: 10.0000 mg | ORAL_TABLET | Freq: Every day | ORAL | 3 refills | Status: DC

## 2024-02-28 NOTE — Progress Notes (Signed)
 Complete physical exam  Patient: Kristina Maldonado   DOB: 12/23/1940   83 y.o. Female  MRN: [id number]  Subjective:     Chief Complaint  Patient presents with   Annual Exam    Cpe.  Some swelling in ankles.     Kristina Maldonado is a 83 y.o. female who presents today for a complete physical exam.  She reports consuming a general and low sodium diet. Gym/ health club routine includes light weights and walking on track . She generally feels well. She reports sleeping well.  She has had some gait issues in the past and did get physical therapy.  She does state that she is not walking much better.  She still has OAB and continues to do well on Myrbetriq .  She continues on losartan /HCTZ and amlodipine .  She does have a history of osteopenia.  Atorvastatin  is causing no difficulty.  In general things are going quite well for her.  She follows up regularly with ophthalmology concerning her cataract but yet has not needed surgery.  She is retired and enjoying her retirement.  Most recent fall risk assessment:    02/28/2024    9:43 AM  Fall Risk   Falls in the past year? 0  Number falls in past yr: 0  Injury with Fall? 0  Risk for fall due to : No Fall Risks  Follow up Falls evaluation completed     Most recent depression screenings:    02/28/2024    9:43 AM 02/15/2023    9:00 AM  PHQ 2/9 Scores  PHQ - 2 Score 0 0    Vision:December 2025. and Dental: No current dental problems and Last dental visit: within the last year.     Immunization History  Administered Date(s) Administered   Fluad Quad(high Dose 65+) 06/17/2019, 08/03/2021, 07/05/2022   Fluad Trivalent(High Dose 65+) 07/12/2023   Influenza Split 08/22/2010, 09/11/2011, 09/03/2012   Influenza, High Dose Seasonal PF 07/08/2013, 07/14/2014, 09/21/2015, 06/28/2017   Influenza, Seasonal, Injecte, Preservative Fre 06/17/2019   Influenza-Unspecified 08/01/2016, 06/28/2017   Moderna Covid-19 Vaccine Bivalent Booster 21yrs & up  10/11/2021   Moderna SARS-COV2 Booster Vaccination 03/24/2021   Moderna Sars-Covid-2 Vaccination 11/03/2019, 12/01/2019, 08/23/2020   PNEUMOCOCCAL CONJUGATE-20 02/28/2024   Pfizer(Comirnaty)Fall Seasonal Vaccine 12 years and older 08/24/2022, 07/12/2023   Pneumococcal Conjugate-13 07/14/2014   Pneumococcal Polysaccharide-23 07/07/2009   Td 08/02/1999   Tdap 09/19/2010, 01/27/2020   Zoster Recombinant(Shingrix) 12/29/2016, 04/18/2017   Zoster, Live 09/24/2006    Health Maintenance  Topic Date Due   COVID-19 Vaccine (7 - Moderna risk 2024-25 season) 01/09/2024   Medicare Annual Wellness (AWV)  02/15/2024   INFLUENZA VACCINE  05/23/2024   DTaP/Tdap/Td (4 - Td or Tdap) 01/26/2030   Pneumonia Vaccine 20+ Years old  Completed   DEXA SCAN  Completed   Zoster Vaccines- Shingrix  Completed   HPV VACCINES  Aged Out   Meningococcal B Vaccine  Aged Out   Colonoscopy  Discontinued   Hepatitis C Screening  Discontinued    Patient Care Team: Watson Hacking, MD as PCP - General (Family Medicine)   Outpatient Medications Prior to Visit  Medication Sig Note   Alpha-D-Galactosidase (BEANO) TABS Take 1 tablet by mouth daily. 02/28/2024: As needed   Ascorbic Acid (VITAMIN C) 100 MG tablet Take 100 mg by mouth daily.    aspirin  81 MG tablet Take 81 mg by mouth daily.    BIOTIN PO Take 500 mcg by mouth.  Calcium -Magnesium -Vitamin D  (CALCIUM  1200+D3 PO) Take 600 mg by mouth once.    Cholecalciferol (VITAMIN D3) 3000 units TABS Take 4,000 Units by mouth daily.    Docusate Sodium  100 MG capsule Take 100 mg by mouth 2 (two) times daily.    ibuprofen (ADVIL) 200 MG tablet Take 200 mg by mouth every 6 (six) hours as needed.    Multiple Vitamins-Minerals (MULTIVITAMIN WITH MINERALS) tablet Take 1 tablet by mouth daily. Reported on 03/22/2016    MYRBETRIQ  25 MG TB24 tablet TAKE 1 TABLET(25 MG) BY MOUTH DAILY    Omega-3 1000 MG CAPS Take by mouth.    Potassium 99 MG TABS Take 99 mg by mouth once.     Probiotic Product (PROBIOTIC DAILY PO) Take 1 capsule by mouth daily.    triamcinolone  cream (KENALOG ) 0.1 % Apply 1 application topically 2 (two) times daily.    diclofenac Sodium (VOLTAREN) 1 % GEL Apply topically 4 (four) times daily. (Patient not taking: Reported on 02/28/2024)    [DISCONTINUED] amLODipine  (NORVASC ) 10 MG tablet TAKE 1 TABLET BY MOUTH DAILY    [DISCONTINUED] atorvastatin  (LIPITOR) 40 MG tablet TAKE 1 TABLET BY MOUTH DAILY    [DISCONTINUED] losartan -hydrochlorothiazide  (HYZAAR) 50-12.5 MG tablet TAKE 1 TABLET BY MOUTH DAILY    No facility-administered medications prior to visit.    Review of Systems  All other systems reviewed and are negative.   Family and social history as well as health maintenance and immunizations was reviewed.     Objective:    BP 116/82   Pulse 77   Ht 5\' 4"  (1.626 m)   Wt 177 lb 6.4 oz (80.5 kg)   BMI 30.45 kg/m    Physical Exam  Alert and in no distress. Tympanic membranes and canals are normal. Pharyngeal area is normal. Neck is supple without adenopathy or thyromegaly. Cardiac exam shows a regular sinus rhythm without murmurs or gallops. Lungs are clear to auscultation.      Assessment & Plan:    Discussed health benefits of physical activity, and encouraged her to engage in regular exercise appropriate for her age and condition.  Routine general medical examination at a health care facility  Osteopenia after menopause - Plan: DG Bone Density  Obesity due to excess calories without serious comorbidity, unspecified class  OAB (overactive bladder)  Primary hypertension - Plan: CBC with Differential/Platelet, Comprehensive metabolic panel with GFR, amLODipine  (NORVASC ) 10 MG tablet, losartan -hydrochlorothiazide  (HYZAAR) 50-12.5 MG tablet  Mixed hyperlipidemia  Other age-related incipient cataract of both eyes  Non-seasonal allergic rhinitis due to pollen  Hyperlipidemia, unspecified hyperlipidemia type - Plan: Lipid panel,  atorvastatin  (LIPITOR) 40 MG tablet  Need for vaccination against Streptococcus pneumoniae - Plan: Pneumococcal conjugate vaccine 20-valent (Prevnar 20)   Return in about 1 year (around 02/27/2025).      Ron Cobbs, MD

## 2024-02-29 ENCOUNTER — Encounter: Payer: Self-pay | Admitting: Family Medicine

## 2024-02-29 LAB — COMPREHENSIVE METABOLIC PANEL WITH GFR
ALT: 13 IU/L (ref 0–32)
AST: 53 IU/L — ABNORMAL HIGH (ref 0–40)
Albumin: 4.2 g/dL (ref 3.8–4.8)
Alkaline Phosphatase: 74 IU/L (ref 44–121)
BUN/Creatinine Ratio: 13 (ref 12–28)
BUN: 13 mg/dL (ref 8–27)
Bilirubin Total: 0.5 mg/dL (ref 0.0–1.2)
CO2: 21 mmol/L (ref 20–29)
Calcium: 10.4 mg/dL — ABNORMAL HIGH (ref 8.7–10.3)
Chloride: 105 mmol/L (ref 96–106)
Creatinine, Ser: 0.98 mg/dL (ref 0.57–1.00)
Globulin, Total: 2.9 g/dL (ref 1.5–4.5)
Glucose: 91 mg/dL (ref 70–99)
Potassium: 5.4 mmol/L — ABNORMAL HIGH (ref 3.5–5.2)
Sodium: 140 mmol/L (ref 134–144)
Total Protein: 7.1 g/dL (ref 6.0–8.5)
eGFR: 58 mL/min/{1.73_m2} — ABNORMAL LOW (ref >59)

## 2024-02-29 LAB — CBC WITH DIFFERENTIAL/PLATELET
Basophils Absolute: 0 10*3/uL (ref 0.0–0.2)
Basos: 1 %
EOS (ABSOLUTE): 0.1 10*3/uL (ref 0.0–0.4)
Eos: 2 %
Hematocrit: 41.9 % (ref 34.0–46.6)
Hemoglobin: 14 g/dL (ref 11.1–15.9)
Immature Grans (Abs): 0 10*3/uL (ref 0.0–0.1)
Immature Granulocytes: 0 %
Lymphocytes Absolute: 1.7 10*3/uL (ref 0.7–3.1)
Lymphs: 50 %
MCH: 32.5 pg (ref 26.6–33.0)
MCHC: 33.4 g/dL (ref 31.5–35.7)
MCV: 97 fL (ref 79–97)
Monocytes Absolute: 0.3 10*3/uL (ref 0.1–0.9)
Monocytes: 7 %
Neutrophils Absolute: 1.4 10*3/uL (ref 1.4–7.0)
Neutrophils: 40 %
Platelets: 181 10*3/uL (ref 150–450)
RBC: 4.31 x10E6/uL (ref 3.77–5.28)
RDW: 13 % (ref 11.7–15.4)
WBC: 3.5 10*3/uL (ref 3.4–10.8)

## 2024-02-29 LAB — LIPID PANEL
Chol/HDL Ratio: 2 ratio (ref 0.0–4.4)
Cholesterol, Total: 219 mg/dL — ABNORMAL HIGH (ref 100–199)
HDL: 109 mg/dL (ref >39)
LDL Chol Calc (NIH): 98 mg/dL (ref 0–99)
Triglycerides: 69 mg/dL (ref 0–149)
VLDL Cholesterol Cal: 12 mg/dL (ref 5–40)

## 2024-03-20 ENCOUNTER — Other Ambulatory Visit: Payer: Self-pay | Admitting: Family Medicine

## 2024-03-20 DIAGNOSIS — Z1231 Encounter for screening mammogram for malignant neoplasm of breast: Secondary | ICD-10-CM

## 2024-04-09 ENCOUNTER — Ambulatory Visit: Payer: Medicare Other | Admitting: Podiatry

## 2024-04-09 ENCOUNTER — Encounter: Payer: Self-pay | Admitting: Podiatry

## 2024-04-09 DIAGNOSIS — M79674 Pain in right toe(s): Secondary | ICD-10-CM

## 2024-04-09 DIAGNOSIS — B351 Tinea unguium: Secondary | ICD-10-CM | POA: Diagnosis not present

## 2024-04-09 DIAGNOSIS — L84 Corns and callosities: Secondary | ICD-10-CM | POA: Diagnosis not present

## 2024-04-09 DIAGNOSIS — M79675 Pain in left toe(s): Secondary | ICD-10-CM | POA: Diagnosis not present

## 2024-04-09 NOTE — Progress Notes (Unsigned)
  Subjective:  Patient ID: Kristina Maldonado, female    DOB: 1941-01-11,  MRN: [id number]  Kristina Maldonado presents to clinic today for {jgcomplaint:23593}  Chief Complaint  Patient presents with   Rfc    Rm17 not diabetic/ Dr. Robina Chol Feb 2025   New problem(s): None. {jgcomplaint:23593}  PCP is Watson Hacking, MD.  No Known Allergies  Review of Systems: Negative except as noted in the HPI.  Objective: No changes noted in today's physical examination. There were no vitals filed for this visit. Kristina Maldonado is a pleasant 83 y.o. female {jgbodyhabitus:24098} AAO x 3.  Assessment/Plan: 1. Pain due to onychomycosis of toenails of both feet     No orders of the defined types were placed in this encounter.   None {Jgplan:23602::-Patient/POA to call should there be question/concern in the interim.}   Return in about 3 months (around 07/10/2024).  Kristina Maldonado, DPM      [place] LOCATION: [address] [place] [address]                   Office [phone]   [facility] LOCATION: [address] [place], [place] [address] Office [phone]

## 2024-04-29 ENCOUNTER — Ambulatory Visit

## 2024-05-06 ENCOUNTER — Telehealth: Payer: Self-pay | Admitting: Internal Medicine

## 2024-05-06 NOTE — Telephone Encounter (Signed)
 Spoke to daughter and has seen some congitive changes, tired, fogginess, processing things in the last month. daughter wants her to be checked for memory issues. Patient is scheduled next Thursday to be seen. Daughter will be coming with her. Just wanted to inform you.    Copied from CRM [id number]. Topic: General - Other >> May 06, 2024  2:17 PM Marissa P wrote: Reason for CRM: Please contact back [name] @ [phone] poa the doctors nurse in regards to some questions etc.

## 2024-05-13 ENCOUNTER — Ambulatory Visit
Admission: RE | Admit: 2024-05-13 | Discharge: 2024-05-13 | Disposition: A | Discharge status: Home / Self Care | Source: Ambulatory Visit | Attending: Family Medicine | Admitting: Family Medicine

## 2024-05-13 DIAGNOSIS — Z1231 Encounter for screening mammogram for malignant neoplasm of breast: Secondary | ICD-10-CM

## 2024-05-15 ENCOUNTER — Encounter: Payer: Self-pay | Admitting: Family Medicine

## 2024-05-15 ENCOUNTER — Ambulatory Visit: Admitting: Family Medicine

## 2024-05-15 VITALS — BP 122/74 | HR 65 | Ht 65.0 in | Wt 176.4 lb

## 2024-05-15 DIAGNOSIS — G3184 Mild cognitive impairment, so stated: Secondary | ICD-10-CM
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
## 2024-05-16 ENCOUNTER — Telehealth: Payer: Self-pay

## 2024-05-16 NOTE — Progress Notes (Signed)
 Complex Care Management Note Care Guide Note  05/16/2024 Name: Kristina Maldonado MRN: [id number] DOB: July 03, 1941   Complex Care Management Outreach Attempts: An unsuccessful telephone outreach was attempted today to offer the patient information about available complex care management services.  Follow Up Plan:  Additional outreach attempts will be made to offer the patient complex care management information and services.   Encounter Outcome:  No Answer  Leotis Rase [facility], [facility] Guide  Direct Dial: [phone]  Fax [phone]

## 2024-05-18 ENCOUNTER — Other Ambulatory Visit: Payer: Self-pay | Admitting: Family Medicine

## 2024-05-18 DIAGNOSIS — E785 Hyperlipidemia, unspecified: Secondary | ICD-10-CM

## 2024-05-19 NOTE — Progress Notes (Signed)
 Complex Care Management Note  Care Guide Note 05/19/2024 Name: Kristina Maldonado MRN: [id number] DOB: 24-Sep-1941  Kristina Maldonado is a 83 y.o. year old female who sees Joyce, Norleen BROCKS, MD for primary care. I reached out to Kristina Maldonado by phone today to offer complex care management services.  [name] was given information about Complex Care Management services today including:   The Complex Care Management services include support from the care team which includes your Nurse Care Manager, Clinical Social Worker, or Pharmacist.  The Complex Care Management team is here to help remove barriers to the health concerns and goals most important to you. Complex Care Management services are voluntary, and the patient may decline or stop services at any time by request to their care team member.   Complex Care Management Consent Status: Patient agreed to services and verbal consent obtained.   Follow up plan:  Telephone appointment with complex care management team member scheduled for:  06/13/24 @ 10 AM   Encounter Outcome:  Patient Scheduled  Leotis Rase [facility], [facility] Guide  Direct Dial: [phone]  Fax [phone]

## 2024-05-20 ENCOUNTER — Telehealth: Payer: Self-pay

## 2024-05-20 NOTE — Progress Notes (Signed)
 Complex Care Management Note Care Guide Note  05/20/2024 Name: Kristina Maldonado MRN: [id number] DOB: 06/19/1941  Kristina Maldonado is a 83 y.o. year old female who is a primary care patient of Kristina Maldonado, Norleen BROCKS, MD . The community resource team was consulted for assistance with Transportation Needs  and Caregiver, volunteer, adult day care   SDOH screenings and interventions completed:  Yes  Social Drivers of Health From This Encounter   Food Insecurity: No Food Insecurity (05/20/2024)   Hunger Vital Sign    Worried About Running Out of Food in the Last Year: Never true    Ran Out of Food in the Last Year: Never true  Housing: Low Risk  (05/20/2024)   Housing Stability Vital Sign    Unable to Pay for Housing in the Last Year: No    Number of Times Moved in the Last Year: 0    Homeless in the Last Year: No  Financial Resource Strain: Low Risk  (05/20/2024)   Overall Financial Resource Strain (CARDIA)    Difficulty of Paying Living Expenses: Not hard at all  Transportation Needs: No Transportation Needs (05/20/2024)   PRAPARE - Administrator, Civil Service (Medical): No    Lack of Transportation (Non-Medical): No  Utilities: Not At Risk (05/20/2024)   Utilities    Threatened with loss of utilities: No    SDOH Interventions Today    Flowsheet Row Most Recent Value  SDOH Interventions   Transportation Interventions Other (Comment)  [Emailed [facility] [place] application to patient's daughter [email or web address].]     Care guide performed the following interventions: Patient provided with information about care guide support team and interviewed to confirm resource needs Emailed resources via [place] for [employer], Caregiver and Adult Day Care. Emailed resources for [employer].  Follow Up Plan:  No further follow up planned at this time. The patient has been provided with needed resources. and Kristina has my name and number for any further assistance.  Encounter  Outcome:  Patient Visit Completed  Kristina Maldonado [facility]  [facility] Guide Direct Dial: [phone]  Fax: [phone] Website: [email or web address]

## 2024-06-13 ENCOUNTER — Other Ambulatory Visit: Payer: Self-pay | Admitting: Licensed Clinical Social Worker

## 2024-06-13 NOTE — Patient Instructions (Signed)
 Visit Information  Thank you for taking time to visit with me today. Please don't hesitate to contact me if I can be of assistance to you before our next scheduled appointment.  Our next appointment is by telephone on 09/19 at 3:30 PM Please call the care guide team at [phone] if you need to cancel or reschedule your appointment.   Following is a copy of your care plan:   Goals Addressed             This Visit's Progress    LCSW VBCI Social Work Care Plan   On track    Problems:   Disease Management support and education needs related to Stress  CSW Clinical Goal(s):   Over the next 60 days the Patient will attend all scheduled medical appointments as evidenced by patient report and care team review of appointment completion in electronic MEDICAL RECORD NUMBER  demonstrate a reduction in symptoms related to Stress .  Interventions:  Mental Health:  Evaluation of current treatment plan related to Stress Active listening / Reflection utilized Emotional Support Provided Mindfulness or Relaxation training provided Provided general psycho-education for mental health needs Solution-Focued Strategies employed:LCSW discussed supportive resources and provided information for [facility] [place] to daughter's email address, per request  Patient Goals/Self-Care Activities:  Continue taking your medication as prescribed.   Increase coping skills, healthy habits, and stress reduction  Plan:   Telephone follow up appointment with care management team member scheduled for:  4 weeks        Please call the Suicide and Crisis Lifeline: 988 call 1-800-273-TALK (toll free, 24 hour hotline) call 911 if you are experiencing a Mental Health or Behavioral Health Crisis or need someone to talk to.  Patient verbalizes understanding of instructions and care plan provided today and agrees to view in MyChart. Active MyChart status and patient understanding of how to access instructions and care plan  via MyChart confirmed with patient.     Kristina Maldonado [facility]  [facility], [facility] Clinical Social Worker Direct Dial: [phone]  Fax: [phone] Website: [email or web address] 11:24 AM

## 2024-06-13 NOTE — Patient Outreach (Signed)
 Complex Care Management   Visit Note  06/13/2024  Name:  LISSETT FAVORITE MRN: [id number] DOB: 1940-12-30  Situation: Referral received for Complex Care Management related to Stress I obtained verbal consent from Patient.  Visit completed with Patient And Pam, pt's daughter  on the phone  Background:   Past Medical History:  Diagnosis Date   Allergy    Dyslipidemia    Hyperlipidemia    Hypertension    Obesity    Osteopenia     Assessment: Patient Reported Symptoms:  Cognitive Cognitive Status: Alert and oriented to person, place, and time, Normal speech and language skills Cognitive/Intellectual Conditions Management [RPT]: Other Other: Mild Cognitive Impairment   Health Maintenance Behaviors: Annual physical exam, Spiritual practice(s)  Neurological Neurological Review of Symptoms: No symptoms reported    HEENT HEENT Symptoms Reported: No symptoms reported      Cardiovascular Cardiovascular Symptoms Reported: No symptoms reported Does patient have uncontrolled Hypertension?: No Cardiovascular Management Strategies: Coping strategies, Medication therapy, Routine screening  Respiratory Respiratory Symptoms Reported: No symptoms reported    Endocrine Endocrine Symptoms Reported: No symptoms reported    Gastrointestinal Gastrointestinal Symptoms Reported: No symptoms reported      Genitourinary Genitourinary Symptoms Reported: No symptoms reported    Integumentary Integumentary Symptoms Reported: No symptoms reported    Musculoskeletal Musculoskelatal Symptoms Reviewed: No symptoms reported   Falls in the past year?: No Number of falls in past year: 1 or less Was there an injury with Fall?: No Fall Risk Category Calculator: 0 Patient Fall Risk Level: Low Fall Risk    Psychosocial Psychosocial Symptoms Reported: No symptoms reported Behavioral Management Strategies: Adequate rest, Coping strategies, Support system Major Change/Loss/Stressor/Fears (CP): Death of a  loved one, Traumatic event Techniques to Cope with Loss/Stress/Change: Diversional activities, Exercise, Spiritual practice(s) Quality of Family Relationships: helpful, involved, supportive Do you feel physically threatened by others?: No    06/13/2024    PHQ2-9 Depression Screening   Little interest or pleasure in doing things    Feeling down, depressed, or hopeless    PHQ-2 - Total Score    Trouble falling or staying asleep, or sleeping too much    Feeling tired or having little energy    Poor appetite or overeating     Feeling bad about yourself - or that you are a failure or have let yourself or your family down    Trouble concentrating on things, such as reading the newspaper or watching television    Moving or speaking so slowly that other people could have noticed.  Or the opposite - being so fidgety or restless that you have been moving around a lot more than usual    Thoughts that you would be better off dead, or hurting yourself in some way    PHQ2-9 Total Score    If you checked off any problems, how difficult have these problems made it for you to do your work, take care of things at home, or get along with other people    Depression Interventions/Treatment      There were no vitals filed for this visit.  Medications Reviewed Today     Reviewed by Faheem Ziemann D, LCSW (Social Worker) on 06/13/24 at 1045  Med List Status: <None>   Medication Order Taking? Sig Documenting Provider Last Dose Status Informant  Alpha-D-Galactosidase [facility]) TABS [id number] Yes Take 1 tablet by mouth daily. [provider]  Active            Med Note (  LATTIE CARLO BROCKS   Thu Feb 28, 2024  9:40 AM) As needed  amLODipine  (NORVASC ) 10 MG tablet [id number] Yes Take 1 tablet (10 mg total) by mouth daily. [name], John C, MD  Active   Ascorbic Acid (VITAMIN C) 100 MG tablet [id number] Yes Take 100 mg by mouth daily. [provider]  Active   aspirin  81 MG tablet [id number] Yes Take 81 mg  by mouth daily. [provider]  Active Self  atorvastatin  (LIPITOR) 40 MG tablet [id number] Yes TAKE 1 TABLET(40 MG) BY MOUTH DAILY [name], John C, MD  Active   BIOTIN PO [id number] Yes Take 500 mcg by mouth. [provider]  Active   Calcium -Magnesium -Vitamin D  (CALCIUM  1200+D3 PO) [id number] Yes Take 600 mg by mouth once. [provider]  Active   Cholecalciferol (VITAMIN D3) 3000 units TABS [id number] Yes Take 4,000 Units by mouth daily. [provider]  Active   diclofenac Sodium (VOLTAREN) 1 % GEL [id number] Yes Apply topically 4 (four) times daily. [provider]  Active            Med Note SAMULE, [facility]   Thu Jun 01, 2022 11:30 AM) Prn last dose yesterday  Docusate Sodium  100 MG capsule [id number] Yes Take 100 mg by mouth 2 (two) times daily. [provider]  Active            Med Note SAMULE, [facility]   Thu Jun 01, 2022 11:31 AM) Prn last dose this am  ibuprofen (ADVIL) 200 MG tablet [id number] Yes Take 200 mg by mouth every 6 (six) hours as needed. [provider]  Active            Med Note SAMULE, [facility]   Thu Jun 01, 2022 11:29 AM) Prn last dose yesterday  losartan -hydrochlorothiazide  (HYZAAR) 50-12.5 MG tablet [id number] Yes Take 1 tablet by mouth daily. [name], John C, MD  Active   Multiple Vitamins-Minerals (MULTIVITAMIN WITH MINERALS) tablet [id number] Yes Take 1 tablet by mouth daily. Reported on 03/22/2016 [provider]  Active Self  MYRBETRIQ  25 MG TB24 tablet [id number] Yes TAKE 1 TABLET(25 MG) BY MOUTH DAILY [name], John C, MD  Active   Omega-3 1000 MG CAPS [id number] Yes Take by mouth. [provider]  Active   Potassium 99 MG TABS [id number] Yes Take 99 mg by mouth once. [provider]  Active   Probiotic Product (PROBIOTIC DAILY PO) [id number] Yes Take 1 capsule by mouth daily. [provider]  Active   triamcinolone  cream (KENALOG ) 0.1 % [id number] Yes Apply 1 application  topically 2 (two) times daily. Tysinger, Alm RAMAN, PA-C  Active             Recommendation:   Continue Current Plan of Care  Follow Up Plan:   Telephone follow-up in 1 month  Rolin Kerns, LCSW [facility]  [facility], [facility] Clinical Social Worker Direct Dial: [phone]  Fax: [phone] Website: [email or web address] 11:24 AM

## 2024-06-16 ENCOUNTER — Other Ambulatory Visit: Payer: Self-pay | Admitting: Family Medicine

## 2024-06-16 DIAGNOSIS — I10 Essential (primary) hypertension: Secondary | ICD-10-CM

## 2024-06-16 MED ORDER — AMLODIPINE BESYLATE 10 MG PO TABS: 10.0000 mg | ORAL_TABLET | Freq: Every day | ORAL | 1 refills | Status: DC

## 2024-06-16 NOTE — Telephone Encounter (Signed)
 Copied from CRM #[id number]. Topic: Clinical - Medication Refill >> Jun 16, 2024  2:52 PM Marissa P wrote: Medication: amLODipine  (NORVASC ) 10 MG tablet  Has the patient contacted their pharmacy? Yes (Agent: If no, request that the patient contact the pharmacy for the refill. If patient does not wish to contact the pharmacy document the reason why and proceed with request.) (Agent: If yes, when and what did the pharmacy advise?)  This is the patient's preferred pharmacy:  [phone] - [place], [place] - [address] AT [facility] [address] [place] [address] Phone: [phone] Fax: [phone]  Is this the correct pharmacy for this prescription? Yes If no, delete pharmacy and type the correct one.   Has the prescription been filled recently? Yes  Is the patient out of the medication? Yes  Has the patient been seen for an appointment in the last year OR does the patient have an upcoming appointment? Yes  Can we respond through MyChart? Yes  Agent: Please be advised that Rx refills may take up to 3 business days. We ask that you follow-up with your pharmacy.

## 2024-07-02 ENCOUNTER — Telehealth: Payer: Self-pay | Admitting: Licensed Clinical Social Worker

## 2024-07-02 ENCOUNTER — Ambulatory Visit: Payer: Self-pay | Admitting: *Deleted

## 2024-07-02 NOTE — Patient Outreach (Signed)
 Complex Care Management   Visit Note  07/02/2024  Name:  Kristina Maldonado MRN: [id number] DOB: Feb 22, 1941  Situation: Referral received for Complex Care Management related to Stress I obtained verbal consent from Patient.  Visit completed with Patient  on the phone  Background:   Past Medical History:  Diagnosis Date   Allergy    Dyslipidemia    Hyperlipidemia    Hypertension    Obesity    Osteopenia     Assessment: Patient Reported Symptoms:  Cognitive Cognitive Status: No symptoms reported Cognitive/Intellectual Conditions Management [RPT]: Other Other: Mild Cognitive Impairment   Health Maintenance Behaviors: Annual physical exam, Spiritual practice(s)  Neurological Neurological Review of Symptoms: Not assessed    HEENT HEENT Symptoms Reported: Not assessed      Cardiovascular Cardiovascular Symptoms Reported: Not assessed    Respiratory Respiratory Symptoms Reported: Not assesed    Endocrine Endocrine Symptoms Reported: Not assessed    Gastrointestinal Gastrointestinal Symptoms Reported: Not assessed      Genitourinary Genitourinary Symptoms Reported: Not assessed    Integumentary Integumentary Symptoms Reported: Not assessed    Musculoskeletal Musculoskelatal Symptoms Reviewed: Not assessed        Psychosocial Psychosocial Symptoms Reported: Other Other Psychosocial Conditions: Socially isolated Additional Psychological Details: Pt's Caregiver endorsed need to enhance socialization. Strategies discussed and resources provided Behavioral Management Strategies: Adequate rest, Community resources, Coping strategies, Support system Major Change/Loss/Stressor/Fears (CP): Traumatic event, Medical condition, self Techniques to Cope with Loss/Stress/Change: Diversional activities, Spiritual practice(s)      07/02/2024    PHQ2-9 Depression Screening   Little interest or pleasure in doing things    Feeling down, depressed, or hopeless    PHQ-2 - Total Score     Trouble falling or staying asleep, or sleeping too much    Feeling tired or having little energy    Poor appetite or overeating     Feeling bad about yourself - or that you are a failure or have let yourself or your family down    Trouble concentrating on things, such as reading the newspaper or watching television    Moving or speaking so slowly that other people could have noticed.  Or the opposite - being so fidgety or restless that you have been moving around a lot more than usual    Thoughts that you would be better off dead, or hurting yourself in some way    PHQ2-9 Total Score    If you checked off any problems, how difficult have these problems made it for you to do your work, take care of things at home, or get along with other people    Depression Interventions/Treatment      There were no vitals filed for this visit.  Medications Reviewed Today     Reviewed by Ezzard Rolin BIRCH, LCSW (Social Worker) on 07/02/24 at 1050  Med List Status: <None>   Medication Order Taking? Sig Documenting Provider Last Dose Status Informant  Alpha-D-Galactosidase [facility]) TABS [id number]  Take 1 tablet by mouth daily. [provider]  Active            Med Note MAPLE, CARLO BROCKS   Thu Feb 28, 2024  9:40 AM) As needed  amLODipine  (NORVASC ) 10 MG tablet [id number]  Take 1 tablet (10 mg total) by mouth daily. [name], John C, MD  Active   Ascorbic Acid (VITAMIN C) 100 MG tablet [id number]  Take 100 mg by mouth daily. [provider]  Active   aspirin  81 MG  tablet [id number]  Take 81 mg by mouth daily. [provider]  Active Self  atorvastatin  (LIPITOR) 40 MG tablet [id number]  TAKE 1 TABLET(40 MG) BY MOUTH DAILY Joyce Norleen BROCKS, MD  Active   BIOTIN PO [id number]  Take 500 mcg by mouth. [provider]  Active   Calcium -Magnesium -Vitamin D  (CALCIUM  1200+D3 PO) [id number]  Take 600 mg by mouth once. [provider]  Active   Cholecalciferol (VITAMIN D3) 3000 units TABS  [id number]  Take 4,000 Units by mouth daily. [provider]  Active   diclofenac Sodium (VOLTAREN) 1 % GEL [id number]  Apply topically 4 (four) times daily. [provider]  Active            Med Note SAMULE, [facility]   Thu Jun 01, 2022 11:30 AM) Prn last dose yesterday  Docusate Sodium  100 MG capsule [id number]  Take 100 mg by mouth 2 (two) times daily. [provider]  Active            Med Note SAMULE, [facility]   Thu Jun 01, 2022 11:31 AM) Prn last dose this am  ibuprofen (ADVIL) 200 MG tablet [id number]  Take 200 mg by mouth every 6 (six) hours as needed. [provider]  Active            Med Note SAMULE, [facility]   Thu Jun 01, 2022 11:29 AM) Prn last dose yesterday  losartan -hydrochlorothiazide  (HYZAAR) 50-12.5 MG tablet [id number]  Take 1 tablet by mouth daily. [name], John C, MD  Active   Multiple Vitamins-Minerals (MULTIVITAMIN WITH MINERALS) tablet [id number]  Take 1 tablet by mouth daily. Reported on 03/22/2016 [provider]  Active Self  MYRBETRIQ  25 MG TB24 tablet [id number]  TAKE 1 TABLET(25 MG) BY MOUTH DAILY [name], John C, MD  Active   Omega-3 1000 MG CAPS [id number]  Take by mouth. [provider]  Active   Potassium 99 MG TABS [id number]  Take 99 mg by mouth once. [provider]  Active   Probiotic Product (PROBIOTIC DAILY PO) [id number]  Take 1 capsule by mouth daily. [provider]  Active   triamcinolone  cream (KENALOG ) 0.1 % [id number]  Apply 1 application topically 2 (two) times daily. Tysinger, Alm RAMAN, PA-C  Active             Recommendation:   Continue Current Plan of Care  Follow Up Plan:   Telephone follow-up 09/19  Rolin Kerns, LCSW [place]  [facility], [facility] Clinical Social Worker Direct Dial: [phone]  Fax: [phone] Website: [email or web address] 10:57 AM

## 2024-07-02 NOTE — Telephone Encounter (Signed)
 Copied from CRM [id number]. Topic: Clinical - Red Word Triage >> Jul 02, 2024  8:14 AM Charlet HERO wrote: Red Word that prompted transfer to Nurse Triage: patients daughter is calling stating that she gave her mother a covid test and she is positive. Would like to followup with doctor to see if it would help her feel better, she is achy runny nose , cough nasal congestion, tired and a little out of it. She is stating that she is feeling a little better she is also coughing up mucus. [facility] Reason for Disposition  Cough with cold symptoms (e.g., runny nose, postnasal drip, throat clearing)  Answer Assessment - Initial Assessment Questions 1. ONSET: When did the cough begin?      [name] calling in, daughter on [place]. Positive for Covid yesterday 07/01/2024.   She has had Covid before.   Due to her age I was wondering if she should be seen.   Is there anything else she needs to do?  Sunday she made me aware of body aches, runny nose, coughing, post nasal drip and fatigue.    On Sunday she felt bad.   We started her on allergy medicine due to her high BP.   That was started on Monday.   It's a homeopathic medicine.   I texted her 20 min ago and she is better this morning.   She is still feeling achy and fatigue.   Poor appetite but she is eating soup.   She's drinking OJ and clear liquids.   I feel like she is doing better and recovering fine.    Her dr. Diagnosed her with a mild cognitive deficit.   I'm not sure if my Mother is manipulating and telling me what I want to hear.   She does sounds better and is doing better. I just wanted to see if she needed to come in or keep doing what we are doing.   I'm a Doctor, general practice.   2. SEVERITY: How bad is the cough today?      She is not c/o chest pain or body aches this morning.   She is using cough drops since Monday.   I have not heard her cough or sound congested in her chest.   She is mainly c/o nasal congestion.     3. SPUTUM: Describe  the color of your sputum (e.g., none, dry cough; clear, white, yellow, green)     She did have a little mucus she coughed up but doesn't know the color.   This morning she sounds fine.  I don't hear congestion.    4. HEMOPTYSIS: Are you coughing up any blood? If Yes, ask: How much? (e.g., flecks, streaks, tablespoons, etc.)     Not asked 5. DIFFICULTY BREATHING: Are you having difficulty breathing? If Yes, ask: How bad is it? (e.g., mild, moderate, severe)      No shortness of breath or chest pain.  I specifically asked her about these 2 things and she denies both. 6. FEVER: Do you have a fever? If Yes, ask: What is your temperature, how was it measured, and when did it start?     I believe she is afebrile.   May be earlier but not now.   Her voice sounds stronger and I don't detect as much nasal congestion.    7. CARDIAC HISTORY: Do you have any history of heart disease? (e.g., heart attack, congestive heart failure)      No 8. LUNG HISTORY:  Do you have any history of lung disease?  (e.g., pulmonary embolus, asthma, emphysema)     No 9. PE RISK FACTORS: Do you have a history of blood clots? (or: recent major surgery, recent prolonged travel, bedridden)     No 10. OTHER SYMPTOMS: Do you have any other symptoms? (e.g., runny nose, wheezing, chest pain)       See above  11. PREGNANCY: Is there any chance you are pregnant? When was your last menstrual period?       N/A 12. TRAVEL: Have you traveled out of the country in the last month? (e.g., travel history, exposures)       N/A  Protocols used: Cough - Acute Productive-A-AH FYI Only or Action Required?: FYI only for provider.  Patient was last seen in primary care on 05/15/2024 by Joyce Norleen BROCKS, MD.  Called Nurse Triage reporting Cough.Tested positive for Covid 07/01/2024.  Taking a homeopathic medication for her runny nose, coughing, body aches and fatigue.  Not eating as much as usual but eating soup and drinking  plenty of fluids.     Per [name] her mother's symptoms began on Sunday but she is doing much better now.   Voice is strong and less nasal congestion now.   I believe my mother is turning the corner and is doing better after talking with her today.   I just wanted to be sure there wasn't anything else she needed to be doing?  Symptoms began several days ago.Sunday.  Started a homeopathic medication on Monday and is doing much better now.  [name] spoke with her this morning and she is doing much better.   Interventions attempted: OTC medications: Homeopathic medication and Rest, hydration, or home remedies. Eating soup and drinking plenty of fluids.  Symptoms are: rapidly improving.  Triage Disposition: Home Care went over home care advice with [name].   Will call if her mother's symptoms change or become worse.   Will continue to monitor for now unless Dr. Joyce has any other suggestions.   Patient/caregiver understands and will follow disposition?: Yes

## 2024-07-02 NOTE — Patient Instructions (Signed)
 Visit Information  Thank you for taking time to visit with me today. Please don't hesitate to contact me if I can be of assistance to you before our next scheduled appointment.  Your next care management appointment is by telephone on 09/19 at 11:30 AM  Please call the care guide team at [phone] if you need to cancel, schedule, or reschedule an appointment.   Please call the Suicide and Crisis Lifeline: 988 go to [facility] Urgent [facility] [address], [place] [phone]) call 911 if you are experiencing a Mental Health or Behavioral Health Crisis or need someone to talk to.  Rolin Kerns, LCSW   [facility], [facility] Clinical Social Worker Direct Dial: [phone]  Fax: [phone] Website: [email or web address] 10:58 AM

## 2024-07-11 ENCOUNTER — Other Ambulatory Visit: Payer: Self-pay | Admitting: Licensed Clinical Social Worker

## 2024-07-18 NOTE — Patient Instructions (Signed)
 Visit Information  Thank you for taking time to visit with me today. Please don't hesitate to contact me if I can be of assistance to you before our next scheduled appointment.  Your next care management appointment is by telephone on 10/17 at 11:30 AM   Please call the care guide team at [phone] if you need to cancel, schedule, or reschedule an appointment.   Please call the Suicide and Crisis Lifeline: 988 go to [facility] Urgent [facility] [address], [place] [phone]) call 911 if you are experiencing a Mental Health or Behavioral Health Crisis or need someone to talk to.  Rolin Kerns, LCSW [place]  [facility], [facility] Clinical Social Worker Direct Dial: [phone]  Fax: [phone] Website: [email or web address] 10:02 AM

## 2024-07-18 NOTE — Patient Outreach (Signed)
 Complex Care Management   Visit Note  07/11/2024  Name:  Kristina Maldonado MRN: [id number] DOB: 1940-11-30  Situation: Referral received for Complex Care Management related to Stress I obtained verbal consent from Patient.  Visit completed with Patient  on the phone  Background:   Past Medical History:  Diagnosis Date   Allergy    Dyslipidemia    Hyperlipidemia    Hypertension    Obesity    Osteopenia     Assessment: Patient Reported Symptoms:  Cognitive Cognitive Status: No symptoms reported, Alert and oriented to person, place, and time, Normal speech and language skills Cognitive/Intellectual Conditions Management [RPT]: Other Other: Mild Cognitive Impairment      Neurological Neurological Review of Symptoms: Not assessed    HEENT HEENT Symptoms Reported: Not assessed      Cardiovascular Cardiovascular Symptoms Reported: Not assessed    Respiratory Respiratory Symptoms Reported: Not assesed    Endocrine Endocrine Symptoms Reported: Not assessed    Gastrointestinal Gastrointestinal Symptoms Reported: Not assessed      Genitourinary Genitourinary Symptoms Reported: Not assessed    Integumentary Integumentary Symptoms Reported: Not assessed    Musculoskeletal Musculoskelatal Symptoms Reviewed: Not assessed        Psychosocial Psychosocial Symptoms Reported: Other Other Psychosocial Conditions: Social isolated Behavioral Management Strategies: Adequate rest, Support system, Community resources, Coping strategies Major Change/Loss/Stressor/Fears (CP): Medical condition, self, Traumatic event Techniques to Cope with Loss/Stress/Change: Diversional activities, Spiritual practice(s)      07/18/2024    PHQ2-9 Depression Screening   Little interest or pleasure in doing things    Feeling down, depressed, or hopeless    PHQ-2 - Total Score    Trouble falling or staying asleep, or sleeping too much    Feeling tired or having little energy    Poor appetite or  overeating     Feeling bad about yourself - or that you are a failure or have let yourself or your family down    Trouble concentrating on things, such as reading the newspaper or watching television    Moving or speaking so slowly that other people could have noticed.  Or the opposite - being so fidgety or restless that you have been moving around a lot more than usual    Thoughts that you would be better off dead, or hurting yourself in some way    PHQ2-9 Total Score    If you checked off any problems, how difficult have these problems made it for you to do your work, take care of things at home, or get along with other people    Depression Interventions/Treatment      There were no vitals filed for this visit.  Medications Reviewed Today     Reviewed by Ezzard Rolin BIRCH, LCSW (Social Worker) on 07/18/24 at [phone]  Med List Status: <None>   Medication Order Taking? Sig Documenting Provider Last Dose Status Informant  Alpha-D-Galactosidase [facility]) TABS [id number]  Take 1 tablet by mouth daily. [provider]  Active            Med Note MAPLE, CARLO BROCKS   Thu Feb 28, 2024  9:40 AM) As needed  amLODipine  (NORVASC ) 10 MG tablet [id number]  Take 1 tablet (10 mg total) by mouth daily. [name], John C, MD  Active   Ascorbic Acid (VITAMIN C) 100 MG tablet [id number]  Take 100 mg by mouth daily. [provider]  Active   aspirin  81 MG tablet [id number]  Take 81 mg by mouth  daily. [provider]  Active Self  atorvastatin  (LIPITOR) 40 MG tablet [id number]  TAKE 1 TABLET(40 MG) BY MOUTH DAILY [name], John C, MD  Active   BIOTIN PO [id number]  Take 500 mcg by mouth. [provider]  Active   Calcium -Magnesium -Vitamin D  (CALCIUM  1200+D3 PO) [id number]  Take 600 mg by mouth once. [provider]  Active   Cholecalciferol (VITAMIN D3) 3000 units TABS [id number]  Take 4,000 Units by mouth daily. [provider]  Active   diclofenac Sodium (VOLTAREN) 1 % GEL  [id number]  Apply topically 4 (four) times daily. [provider]  Active            Med Note SAMULE, [facility]   Thu Jun 01, 2022 11:30 AM) Prn last dose yesterday  Docusate Sodium  100 MG capsule [id number]  Take 100 mg by mouth 2 (two) times daily. [provider]  Active            Med Note SAMULE, [facility]   Thu Jun 01, 2022 11:31 AM) Prn last dose this am  ibuprofen (ADVIL) 200 MG tablet [id number]  Take 200 mg by mouth every 6 (six) hours as needed. [provider]  Active            Med Note SAMULE, [facility]   Thu Jun 01, 2022 11:29 AM) Prn last dose yesterday  losartan -hydrochlorothiazide  (HYZAAR) 50-12.5 MG tablet [id number]  Take 1 tablet by mouth daily. [name], John C, MD  Active   Multiple Vitamins-Minerals (MULTIVITAMIN WITH MINERALS) tablet [id number]  Take 1 tablet by mouth daily. Reported on 03/22/2016 [provider]  Active Self  MYRBETRIQ  25 MG TB24 tablet [id number]  TAKE 1 TABLET(25 MG) BY MOUTH DAILY Joyce Norleen BROCKS, MD  Active   Omega-3 1000 MG CAPS [id number]  Take by mouth. [provider]  Active   Potassium 99 MG TABS [id number]  Take 99 mg by mouth once. [provider]  Active   Probiotic Product (PROBIOTIC DAILY PO) [id number]  Take 1 capsule by mouth daily. [provider]  Active   triamcinolone  cream (KENALOG ) 0.1 % [id number]  Apply 1 application topically 2 (two) times daily. Tysinger, Alm RAMAN, PA-C  Active             Recommendation:   Continue Current Plan of Care  Follow Up Plan:   Telephone follow-up in 1 month  Rolin Kerns, LCSW [facility]  [facility], [facility] Clinical Social Worker Direct Dial: [phone]  Fax: [phone] Website: [email or web address] 10:02 AM

## 2024-07-29 ENCOUNTER — Ambulatory Visit: Admitting: Podiatry

## 2024-07-29 ENCOUNTER — Encounter: Payer: Self-pay | Admitting: Podiatry

## 2024-07-29 DIAGNOSIS — M79674 Pain in right toe(s): Secondary | ICD-10-CM | POA: Diagnosis not present

## 2024-07-29 DIAGNOSIS — L84 Corns and callosities: Secondary | ICD-10-CM

## 2024-07-29 DIAGNOSIS — M79675 Pain in left toe(s): Secondary | ICD-10-CM

## 2024-07-29 DIAGNOSIS — B351 Tinea unguium: Secondary | ICD-10-CM

## 2024-07-29 NOTE — Progress Notes (Signed)
  Subjective:  Patient ID: Kristina Maldonado, female    DOB: [birth year],  MRN: [id number]  Kristina Maldonado presents to clinic today for corn(s) left foot and painful thick toenails that are difficult to trim. Painful toenails interfere with ambulation. Aggravating factors include wearing enclosed shoe gear. Pain is relieved with periodic professional debridement. Painful corns are aggravated when weightbearing when wearing enclosed shoe gear. Pain is relieved with periodic professional debridement. Patient states she attended her grandson's wedding in [place] and her dress shoes were uncomfortable due to the corn on her left 5th toe. Chief Complaint  Patient presents with   Nail Problem    Patient stated she knows who her PCP is, his name is Kristina Maldonado and she last seen him in the end of June.   New problem(s): None.   PCP is Maldonado Kristina BROCKS, MD.  No Known Allergies  Review of Systems: Negative except as noted in the HPI.  Objective: No changes noted in today's physical examination. There were no vitals filed for this visit. Kristina Maldonado is a pleasant 83 y.o. female WD, WN in NAD. AAO x 3.  Vascular Examination: Capillary refill time immediate b/l. Palpable pedal pulses. Pedal hair diminished b/l. No pain with calf compression b/l. Skin temperature gradient WNL b/l. No cyanosis or clubbing b/l. No ischemia or gangrene noted b/l. Trace edema noted BLE. No ischemia or gangrene noted b/l LE. No cyanosis or clubbing noted b/l LE.  Neurological Examination: Sensation grossly intact b/l with 10 gram monofilament. Vibratory sensation intact b/l.   Dermatological Examination: Pedal skin with normal turgor, texture and tone b/l.  No open wounds. No interdigital macerations.   Toenails 1-5 b/l thick, discolored, elongated with subungual debris and pain on dorsal palpation.   Hyperkeratotic lesion(s) dorsal PIPJ of L 5th toe.  No erythema, no edema, no drainage, no  fluctuance.  Musculoskeletal Examination: Muscle strength 5/5 to all lower extremity muscle groups bilaterally. Hammertoe(s) b/l 5th toes. No pain, crepitus or joint limitation noted with ROM b/l LE.  Patient ambulates independently without assistive aids.  Radiographs: None  Assessment/Plan: 1. Pain due to onychomycosis of toenails of both feet   2. Corn left 5th digit   3. Pain in left toe(s)   Consent given for treatment. Patient examined. All patient's and/or POA's questions/concerns addressed on today's visit. Toenails 1-5 debrided in length and girth without incident. Corn(s) dorsal PIPJ of L 5th toe pared with sharp debridement without incident. Discussed non-leather dress shoes such as Aerosoles wide width for dressy occasions. Continue soft, supportive shoe gear daily. Report any pedal injuries to medical professional. Call office if there are any questions/concerns.  Return in about 3 months (around 10/29/2024).  Delon LITTIE Merlin, DPM      [place] LOCATION: [address] [place] [address]                   Office [phone]   [facility] LOCATION: [address] [place], [place] [address] Office [phone]

## 2024-08-08 ENCOUNTER — Encounter: Payer: Self-pay | Admitting: Licensed Clinical Social Worker

## 2024-08-08 ENCOUNTER — Telehealth: Payer: Self-pay | Admitting: Licensed Clinical Social Worker

## 2024-08-13 NOTE — Patient Instructions (Signed)
 Kristina Maldonado - I am sorry I was unable to reach you today for our scheduled appointment. I work with Joyce Norleen BROCKS, MD and am calling to support your healthcare needs. Please contact me at [phone] at your earliest convenience. I look forward to speaking with you soon.   Thank you,  Rolin Kerns, LCSW [place]  [facility], [facility] Clinical Social Worker Direct Dial: [phone]  Fax: [phone] Website: [email or web address] 5:17 AM

## 2024-09-03 ENCOUNTER — Other Ambulatory Visit: Payer: Self-pay | Admitting: Licensed Clinical Social Worker

## 2024-09-03 NOTE — Patient Outreach (Signed)
 Complex Care Management   Visit Note  09/03/2024  Name:  Kristina Maldonado MRN: [id number] DOB: Jun 10, 1941  Situation: Referral received for Complex Care Management related to Stress I obtained verbal consent from Patient.  Visit completed with Patient  on the phone  Background:   Past Medical History:  Diagnosis Date   Allergy    Dyslipidemia    Hyperlipidemia    Hypertension    Obesity    Osteopenia     Assessment: Patient Reported Symptoms:  Cognitive Cognitive Status: No symptoms reported, Alert and oriented to person, place, and time, Normal speech and language skills Cognitive/Intellectual Conditions Management [RPT]: Other Other: Mild Cognitive Impairment   Health Maintenance Behaviors: Annual physical exam  Neurological Neurological Review of Symptoms: No symptoms reported    HEENT HEENT Symptoms Reported: No symptoms reported      Cardiovascular Cardiovascular Symptoms Reported: No symptoms reported    Respiratory Respiratory Symptoms Reported: No symptoms reported    Endocrine Endocrine Symptoms Reported: No symptoms reported Is patient diabetic?: No    Gastrointestinal Gastrointestinal Symptoms Reported: Constipation Additional Gastrointestinal Details: Occassional constipation Gastrointestinal Management Strategies: Coping strategies    Genitourinary Genitourinary Symptoms Reported: No symptoms reported    Integumentary Integumentary Symptoms Reported: No symptoms reported    Musculoskeletal Musculoskelatal Symptoms Reviewed: Joint pain Additional Musculoskeletal Details: Arthritis Musculoskeletal Management Strategies: Coping strategies      Psychosocial Psychosocial Symptoms Reported: Other Other Psychosocial Conditions: Social Isolation Behavioral Management Strategies: Adequate rest, Support system, Coping strategies Behavioral Health Comment: Discussed strategies to establish boundaries and prioritizing self/rest Techniques to Cope with  Loss/Stress/Change: Diversional activities, Spiritual practice(s) Quality of Family Relationships: helpful, involved, supportive    09/03/2024    PHQ2-9 Depression Screening   Little interest or pleasure in doing things    Feeling down, depressed, or hopeless    PHQ-2 - Total Score    Trouble falling or staying asleep, or sleeping too much    Feeling tired or having little energy    Poor appetite or overeating     Feeling bad about yourself - or that you are a failure or have let yourself or your family down    Trouble concentrating on things, such as reading the newspaper or watching television    Moving or speaking so slowly that other people could have noticed.  Or the opposite - being so fidgety or restless that you have been moving around a lot more than usual    Thoughts that you would be better off dead, or hurting yourself in some way    PHQ2-9 Total Score    If you checked off any problems, how difficult have these problems made it for you to do your work, take care of things at home, or get along with other people    Depression Interventions/Treatment      There were no vitals filed for this visit.    Medications Reviewed Today     Reviewed by Ezzard Rolin BIRCH, LCSW (Social Worker) on 09/03/24 at 1136  Med List Status: <None>   Medication Order Taking? Sig Documenting Provider Last Dose Status Informant  Alpha-D-Galactosidase [facility]) TABS [id number]  Take 1 tablet by mouth daily. [provider]  Active            Med Note MAPLE, CARLO BROCKS   Thu Feb 28, 2024  9:40 AM) As needed  amLODipine  (NORVASC ) 10 MG tablet [id number]  Take 1 tablet (10 mg total) by mouth daily. Joyce Norleen BROCKS, MD  Active   Ascorbic Acid (VITAMIN C) 100 MG tablet [id number]  Take 100 mg by mouth daily. [provider]  Active   aspirin  81 MG tablet [id number]  Take 81 mg by mouth daily. [provider]  Active Self  atorvastatin  (LIPITOR) 40 MG tablet [id number]  TAKE 1 TABLET(40  MG) BY MOUTH DAILY [name], John C, MD  Active   BIOTIN PO [id number]  Take 500 mcg by mouth. [provider]  Active   Calcium -Magnesium -Vitamin D  (CALCIUM  1200+D3 PO) [id number]  Take 600 mg by mouth once. [provider]  Active   Cholecalciferol (VITAMIN D3) 3000 units TABS [id number]  Take 4,000 Units by mouth daily. [provider]  Active   diclofenac Sodium (VOLTAREN) 1 % GEL [id number]  Apply topically 4 (four) times daily. [provider]  Active            Med Note SAMULE, [facility]   Thu Jun 01, 2022 11:30 AM) Prn last dose yesterday  Docusate Sodium  100 MG capsule [id number]  Take 100 mg by mouth 2 (two) times daily. [provider]  Active            Med Note SAMULE, [facility]   Thu Jun 01, 2022 11:31 AM) Prn last dose this am  ibuprofen (ADVIL) 200 MG tablet [id number]  Take 200 mg by mouth every 6 (six) hours as needed. [provider]  Active            Med Note SAMULE, [facility]   Thu Jun 01, 2022 11:29 AM) Prn last dose yesterday  losartan -hydrochlorothiazide  (HYZAAR) 50-12.5 MG tablet [id number]  Take 1 tablet by mouth daily. [name], John C, MD  Active   Multiple Vitamins-Minerals (MULTIVITAMIN WITH MINERALS) tablet [id number]  Take 1 tablet by mouth daily. Reported on 03/22/2016 [provider]  Active Self  MYRBETRIQ  25 MG TB24 tablet [id number]  TAKE 1 TABLET(25 MG) BY MOUTH DAILY Joyce Norleen BROCKS, MD  Active   Omega-3 1000 MG CAPS [id number]  Take by mouth. [provider]  Active   Potassium 99 MG TABS [id number]  Take 99 mg by mouth once. [provider]  Active   Probiotic Product (PROBIOTIC DAILY PO) [id number]  Take 1 capsule by mouth daily. [provider]  Active   triamcinolone  cream (KENALOG ) 0.1 % [id number]  Apply 1 application topically 2 (two) times daily. Tysinger, Alm RAMAN, PA-C  Active             Recommendation:   Continue Current Plan of Care  Follow Up Plan:   Telephone  follow-up in 1 month  Rolin Kerns, LCSW [facility]  [facility], [facility] Clinical Social Worker Direct Dial: [phone]  Fax: [phone] Website: [email or web address] 11:51 AM

## 2024-09-03 NOTE — Patient Instructions (Signed)
 Visit Information  Thank you for taking time to visit with me today. Please don't hesitate to contact me if I can be of assistance to you before our next scheduled appointment.  Your next care management appointment is by telephone on 12/10 at 11:30 AM  Please call the care guide team at [phone] if you need to cancel, schedule, or reschedule an appointment.   Please call the Suicide and Crisis Lifeline: 988 go to [facility] Urgent [facility] [address] [place] [phone]) call 911 if you are experiencing a Mental Health or Behavioral Health Crisis or need someone to talk to.  Rolin Kerns, LCSW [place]  [facility], [facility] Clinical Social Worker Direct Dial: [phone]  Fax: [phone] Website: [email or web address] 11:51 AM

## 2024-10-01 ENCOUNTER — Other Ambulatory Visit: Payer: Self-pay | Admitting: Licensed Clinical Social Worker

## 2024-10-01 NOTE — Patient Outreach (Signed)
 Complex Care Management   Visit Note  10/01/2024  Name:  Kristina Maldonado MRN: [id number] DOB: 12-30-1940  Situation: Referral received for Complex Care Management related to Stress I obtained verbal consent from Patient.  Visit completed with Patient  on the phone  Background:   Past Medical History:  Diagnosis Date   Allergy    Dyslipidemia    Hyperlipidemia    Hypertension    Obesity    Osteopenia     Assessment: Patient Reported Symptoms:  Cognitive Cognitive Status: No symptoms reported, Alert and oriented to person, place, and time, Normal speech and language skills Cognitive/Intellectual Conditions Management [RPT]: None reported or documented in medical history or problem list   Health Maintenance Behaviors: Annual physical exam  Neurological Neurological Review of Symptoms: No symptoms reported    HEENT HEENT Symptoms Reported: No symptoms reported      Cardiovascular Cardiovascular Symptoms Reported: No symptoms reported    Respiratory Respiratory Symptoms Reported: No symptoms reported    Endocrine Endocrine Symptoms Reported: No symptoms reported Is patient diabetic?: No    Gastrointestinal Gastrointestinal Symptoms Reported: Constipation Gastrointestinal Management Strategies: Adequate rest, Coping strategies Gastrointestinal Comment: Patient reports using OTC and prune juice to assist with symptom managment    Genitourinary Genitourinary Symptoms Reported: No symptoms reported    Integumentary Integumentary Symptoms Reported: No symptoms reported    Musculoskeletal Musculoskelatal Symptoms Reviewed: Joint pain Musculoskeletal Management Strategies: Medication therapy, Coping strategies      Psychosocial Psychosocial Symptoms Reported: No symptoms reported Additional Psychological Details: Patient is doing well and participating in Bible study weekly to promote socialization Behavioral Management Strategies: Coping strategies, Support system, Adequate  rest Major Change/Loss/Stressor/Fears (CP): Medical condition, self Techniques to Cope with Loss/Stress/Change: Diversional activities, Spiritual practice(s) Quality of Family Relationships: involved, supportive, helpful    10/01/2024    PHQ2-9 Depression Screening   Little interest or pleasure in doing things    Feeling down, depressed, or hopeless    PHQ-2 - Total Score    Trouble falling or staying asleep, or sleeping too much    Feeling tired or having little energy    Poor appetite or overeating     Feeling bad about yourself - or that you are a failure or have let yourself or your family down    Trouble concentrating on things, such as reading the newspaper or watching television    Moving or speaking so slowly that other people could have noticed.  Or the opposite - being so fidgety or restless that you have been moving around a lot more than usual    Thoughts that you would be better off dead, or hurting yourself in some way    PHQ2-9 Total Score    If you checked off any problems, how difficult have these problems made it for you to do your work, take care of things at home, or get along with other people    Depression Interventions/Treatment      There were no vitals filed for this visit.    Medications Reviewed Today     Reviewed by Bazil Dhanani D, LCSW (Social Worker) on 10/01/24 at 1138  Med List Status: <None>   Medication Order Taking? Sig Documenting Provider Last Dose Status Informant  Alpha-D-Galactosidase [facility]) TABS [id number]  Take 1 tablet by mouth daily. [provider]  Active            Med Note MAPLE, CARLO BROCKS   Thu Feb 28, 2024  9:40 AM) As  needed  amLODipine  (NORVASC ) 10 MG tablet [id number]  Take 1 tablet (10 mg total) by mouth daily. [name], John C, MD  Active   Ascorbic Acid (VITAMIN C) 100 MG tablet [id number]  Take 100 mg by mouth daily. [provider]  Active   aspirin  81 MG tablet [id number]  Take 81 mg by mouth daily. [provider]  Active Self  atorvastatin  (LIPITOR) 40 MG tablet [id number]  TAKE 1 TABLET(40 MG) BY MOUTH DAILY Joyce Norleen BROCKS, MD  Active   BIOTIN PO [id number]  Take 500 mcg by mouth. [provider]  Active   Calcium -Magnesium -Vitamin D  (CALCIUM  1200+D3 PO) [id number]  Take 600 mg by mouth once. [provider]  Active   Cholecalciferol (VITAMIN D3) 3000 units TABS [id number]  Take 4,000 Units by mouth daily. [provider]  Active   diclofenac Sodium (VOLTAREN) 1 % GEL [id number]  Apply topically 4 (four) times daily. [provider]  Active            Med Note SAMULE, [facility]   Thu Jun 01, 2022 11:30 AM) Prn last dose yesterday  Docusate Sodium  100 MG capsule [id number]  Take 100 mg by mouth 2 (two) times daily. [provider]  Active            Med Note SAMULE, [facility]   Thu Jun 01, 2022 11:31 AM) Prn last dose this am  ibuprofen (ADVIL) 200 MG tablet [id number]  Take 200 mg by mouth every 6 (six) hours as needed. [provider]  Active            Med Note SAMULE, [facility]   Thu Jun 01, 2022 11:29 AM) Prn last dose yesterday  losartan -hydrochlorothiazide  (HYZAAR) 50-12.5 MG tablet [id number]  Take 1 tablet by mouth daily. [name], John C, MD  Active   Multiple Vitamins-Minerals (MULTIVITAMIN WITH MINERALS) tablet [id number]  Take 1 tablet by mouth daily. Reported on 03/22/2016 [provider]  Active Self  MYRBETRIQ  25 MG TB24 tablet [id number]  TAKE 1 TABLET(25 MG) BY MOUTH DAILY Joyce Norleen BROCKS, MD  Active   Omega-3 1000 MG CAPS [id number]  Take by mouth. [provider]  Active   Potassium 99 MG TABS [id number]  Take 99 mg by mouth once. [provider]  Active   Probiotic Product (PROBIOTIC DAILY PO) [id number]  Take 1 capsule by mouth daily. [provider]  Active   triamcinolone  cream (KENALOG ) 0.1 % [id number]  Apply 1 application topically 2 (two) times daily. Tysinger, Alm RAMAN, PA-C  Active              Recommendation:   Continue Current Plan of Care  Follow Up Plan:   Telephone follow-up in 1 month  Rolin Kerns, LCSW [facility]  [facility], [facility] Clinical Social Worker Direct Dial: [phone]  Fax: [phone] Website: [email or web address] 11:55 AM

## 2024-10-01 NOTE — Patient Instructions (Signed)
 Visit Information  Thank you for taking time to visit with me today. Please don't hesitate to contact me if I can be of assistance to you before our next scheduled appointment.  Your next care management appointment is by telephone on 01/14 at 11:30 AM  Please call the care guide team at [phone] if you need to cancel, schedule, or reschedule an appointment.   Please call the Suicide and Crisis Lifeline: 988 go to [facility] [address] [place] [phone]) call 911 if you are experiencing a Mental Health or Behavioral Health Crisis or need someone to talk to.  Rolin Kerns, LCSW [place]  [facility], [facility] Clinical Social Worker Direct Dial: [phone]  Fax: [phone] Website: [email or web address] 11:57 AM

## 2024-10-22 ENCOUNTER — Ambulatory Visit (INDEPENDENT_AMBULATORY_CARE_PROVIDER_SITE_OTHER): Admitting: Family Medicine

## 2024-10-22 ENCOUNTER — Encounter: Payer: Self-pay | Admitting: Family Medicine

## 2024-10-22 VITALS — BP 114/74 | HR 62 | Temp 96.5°F | Wt 169.4 lb

## 2024-10-22 DIAGNOSIS — K5900 Constipation, unspecified: Secondary | ICD-10-CM

## 2024-10-22 MED ORDER — POLYETHYLENE GLYCOL 3350 17 GM/SCOOP PO POWD: 17.0000 g | Freq: Every day | ORAL | 1 refills | Status: AC

## 2024-10-22 NOTE — Patient Instructions (Signed)
 VISIT SUMMARY:  Today, we discussed your ongoing issues with constipation and difficulty passing stool. You were accompanied by your daughter, and we reviewed your current symptoms and treatment regimen.  YOUR PLAN:  -CONSTIPATION: Constipation is when you have infrequent or difficult bowel movements. It is often caused by not eating enough fiber or drinking enough fluids. To help manage this, you should increase your fiber intake to 35 grams per day and take Miralax twice daily until Friday. Make sure to drink at least 50 ounces of fluids each day, including water, juices, or Gatorade. After Friday, you can adjust the Miralax to once daily or every other day based on how often you have bowel movements. If there is no improvement by Friday, we may need to consider further imaging tests.  INSTRUCTIONS:  Please follow the new plan for managing your constipation, including the increased fiber and fluid intake and the adjusted Miralax dosage. Monitor your symptoms and if there is no improvement by Friday, contact us  for further evaluation. Your Miralax prescription has been sent to the pharmacy.

## 2024-10-22 NOTE — Progress Notes (Signed)
 "  Name: Kristina Maldonado   Date of Visit: 10/22/2024   Date of last visit with me: Visit date not found   CHIEF COMPLAINT:  Chief Complaint  Patient presents with   Acute Visit    Hernia- been there a long time, having some discomfort when having a bowel movement. Stomach feels tight.        HPI:  Discussed the use of AI scribe software for clinical note transcription with the patient, who gave verbal consent to proceed.  History of Present Illness   Kristina Maldonado is an 83 year old female who presents with constipation and difficulty passing stool. She is accompanied by her daughter.  She experiences periodic difficulty passing stool, requiring her to apply pressure during bowel movements, particularly when her stool is tight, leading to discomfort. She associates these symptoms with constipation.  She has been using Docusate, a stool softener, but it does not provide significant relief. Her daughter mentions that she does not take her medication as prescribed and has been drinking 'smooth tea', which has not been effective. She has been trying to ensure she eats and drinks adequately, as her diet is inconsistent, and she often nibbles rather than eating full meals.  There is a mention of a hernia previously identified by another doctor, but it is unclear if this is contributing to her symptoms.  Her current medication regimen includes Docusate, but she has not been consistent with it. Her daughter is trying to encourage her to drink more water and maintain a regular diet to help alleviate her symptoms.  During the review of symptoms, she experiences discomfort and straining during bowel movements.         OBJECTIVE:       02/28/2024    9:43 AM  Depression screen PHQ 2/9  Decreased Interest 0  Down, Depressed, Hopeless 0  PHQ - 2 Score 0     BP Readings from Last 3 Encounters:  10/22/24 114/74  05/15/24 122/74  02/28/24 116/82    BP 114/74   Pulse 62   Temp (!)  96.5 F (35.8 C)   Wt 169 lb 6.4 oz (76.8 kg)   SpO2 95%   BMI 28.19 kg/m    Physical Exam          Physical Exam Constitutional:      Appearance: Normal appearance.  Neurological:     General: No focal deficit present.     Mental Status: She is alert and oriented to person, place, and time. Mental status is at baseline.     ASSESSMENT/PLAN:   Assessment & Plan Constipation, unspecified constipation type    Assessment and Plan    Constipation Intermittent constipation likely due to inadequate fiber and fluid intake. Hernia not contributing to bowel compression. Risk of bowel obstruction if unmanaged. - Increase fiber intake to 35 grams per day using Miralax twice daily until Friday. - Ensure fluid intake of at least 50 ounces per day, including water, juices, or Gatorade. - Adjust Miralax to once daily or every other day after Friday based on bowel movement frequency. - Monitor for improvement by Friday; consider imaging if no improvement. - Sent Miralax prescription to pharmacy.       I personally spent a total of 32 minutes in the care of the patient today including preparing to see the patient, getting/reviewing separately obtained history, performing a medically appropriate exam/evaluation, counseling and educating, placing orders, referring and communicating with other health care professionals, documenting  clinical information in the EHR, independently interpreting results, communicating results, and coordinating care.   Corsica Franson A. Vita MD [facility] "

## 2024-11-01 ENCOUNTER — Other Ambulatory Visit: Payer: Self-pay | Admitting: Family Medicine

## 2024-11-01 DIAGNOSIS — I10 Essential (primary) hypertension: Secondary | ICD-10-CM

## 2024-11-03 NOTE — Telephone Encounter (Signed)
Appt in may

## 2024-11-05 ENCOUNTER — Other Ambulatory Visit: Admitting: Licensed Clinical Social Worker

## 2024-11-05 DIAGNOSIS — I10 Essential (primary) hypertension: Secondary | ICD-10-CM

## 2024-11-05 NOTE — Patient Instructions (Signed)
 Visit Information  Thank you for taking time to visit with me today. Please don't hesitate to contact me if I can be of assistance to you before our next scheduled appointment.  Your next care management appointment is by telephone on 2/11 at 11:30 AM  Please call the care guide team at [phone] if you need to cancel, schedule, or reschedule an appointment.   Please call the Suicide and Crisis Lifeline: 988 go to [facility] [address], [place] [phone]) call 911 if you are experiencing a Mental Health or Behavioral Health Crisis or need someone to talk to.  Rolin Kerns, LCSW [place]  [facility], [facility] Clinical Social Worker Direct Dial: [phone]  Fax: [phone] Website: [email or web address] 1:59 PM

## 2024-11-05 NOTE — Patient Outreach (Signed)
 Complex Care Management   Visit Note  11/05/2024  Name:  Kristina Maldonado MRN: [id number] DOB: 1940/12/15  Situation: Referral received for Complex Care Management related to Stress I obtained verbal consent from Patient and daughter, [name] (pt provided verbal consent)  Visit completed with Patient  on the phone  Background:   Past Medical History:  Diagnosis Date   Allergy    Dyslipidemia    Hyperlipidemia    Hypertension    Obesity    Osteopenia     Assessment: Patient Reported Symptoms:  Cognitive Cognitive Status: No symptoms reported, Alert and oriented to person, place, and time, Normal speech and language skills Cognitive/Intellectual Conditions Management [RPT]: None reported or documented in medical history or problem list   Health Maintenance Behaviors: Annual physical exam  Neurological Neurological Review of Symptoms: No symptoms reported    HEENT HEENT Symptoms Reported: Not assessed      Cardiovascular Cardiovascular Symptoms Reported: No symptoms reported    Respiratory Respiratory Symptoms Reported: No symptoms reported    Endocrine Endocrine Symptoms Reported: No symptoms reported    Gastrointestinal Gastrointestinal Symptoms Reported: No symptoms reported Gastrointestinal Management Strategies: Adequate rest, Coping strategies, Medication therapy Gastrointestinal Comment: Pt endorses constipation has resolved since medication adjustments from recent PCP appt. Family are assisting with meal prep to decrease barriers in eating a healthy balanced diet    Genitourinary Genitourinary Symptoms Reported: No symptoms reported    Integumentary Integumentary Symptoms Reported: No symptoms reported    Musculoskeletal Musculoskelatal Symptoms Reviewed: Joint pain Musculoskeletal Management Strategies: Coping strategies, Routine screening, Medication therapy      Psychosocial Psychosocial Symptoms Reported: No symptoms reported Behavioral Management Strategies:  Adequate rest, Coping strategies, Support system Major Change/Loss/Stressor/Fears (CP): Medical condition, self Techniques to Cope with Loss/Stress/Change: Diversional activities, Spiritual practice(s) Quality of Family Relationships: helpful, involved, supportive    11/05/2024    PHQ2-9 Depression Screening   Little interest or pleasure in doing things    Feeling down, depressed, or hopeless    PHQ-2 - Total Score    Trouble falling or staying asleep, or sleeping too much    Feeling tired or having little energy    Poor appetite or overeating     Feeling bad about yourself - or that you are a failure or have let yourself or your family down    Trouble concentrating on things, such as reading the newspaper or watching television    Moving or speaking so slowly that other people could have noticed.  Or the opposite - being so fidgety or restless that you have been moving around a lot more than usual    Thoughts that you would be better off dead, or hurting yourself in some way    PHQ2-9 Total Score    If you checked off any problems, how difficult have these problems made it for you to do your work, take care of things at home, or get along with other people    Depression Interventions/Treatment      There were no vitals filed for this visit.    Medications Reviewed Today     Reviewed by Jozy Mcphearson D, LCSW (Social Worker) on 11/05/24 at 1350  Med List Status: <None>   Medication Order Taking? Sig Documenting Provider Last Dose Status Informant  Alpha-D-Galactosidase [facility]) TABS [id number]  Take 1 tablet by mouth daily. [provider]  Active            Med Note (GANN, RILEY C   Thu  Feb 28, 2024  9:40 AM) As needed  amLODipine  (NORVASC ) 10 MG tablet [id number]  TAKE 1 TABLET(10 MG) BY MOUTH DAILY Jha, Panav, MD  Active   Ascorbic Acid (VITAMIN C) 100 MG tablet [id number]  Take 100 mg by mouth daily. [provider]  Active   aspirin  81 MG tablet [id number]  Take 81  mg by mouth daily. [provider]  Active Self  atorvastatin  (LIPITOR) 40 MG tablet [id number]  TAKE 1 TABLET(40 MG) BY MOUTH DAILY [name], John C, MD  Active   BIOTIN PO [id number]  Take 500 mcg by mouth. [provider]  Active   Calcium -Magnesium -Vitamin D  (CALCIUM  1200+D3 PO) [id number]  Take 600 mg by mouth once. [provider]  Active   Cholecalciferol (VITAMIN D3) 3000 units TABS [id number]  Take 4,000 Units by mouth daily. [provider]  Active   Docusate Sodium  100 MG capsule [id number]  Take 100 mg by mouth 2 (two) times daily. [provider]  Active            Med Note SAMULE, [facility]   Thu Jun 01, 2022 11:31 AM) Prn last dose this am  ibuprofen (ADVIL) 200 MG tablet [id number]  Take 200 mg by mouth every 6 (six) hours as needed. [provider]  Active            Med Note SAMULE, [facility]   Thu Jun 01, 2022 11:29 AM) Prn last dose yesterday  losartan -hydrochlorothiazide  (HYZAAR) 50-12.5 MG tablet [id number]  Take 1 tablet by mouth daily. [name], John C, MD  Active   Multiple Vitamins-Minerals (MULTIVITAMIN WITH MINERALS) tablet [id number]  Take 1 tablet by mouth daily. Reported on 03/22/2016 [provider]  Active Self  MYRBETRIQ  25 MG TB24 tablet [id number]  TAKE 1 TABLET(25 MG) BY MOUTH DAILY [name], John C, MD  Active   Omega-3 1000 MG CAPS [id number]  Take by mouth. [provider]  Active   polyethylene glycol powder (GLYCOLAX /MIRALAX ) 17 GM/SCOOP powder [id number]  Take 17 g by mouth daily. Dissolve 1 capful (17g) in 4-8 ounces of liquid and take by mouth daily. Jha, Panav, MD  Active   Potassium 99 MG TABS [id number]  Take 99 mg by mouth once. [provider]  Active   Probiotic Product (PROBIOTIC DAILY PO) [id number]  Take 1 capsule by mouth daily. [provider]  Active   triamcinolone  cream (KENALOG ) 0.1 % [id number]  Apply 1 application topically 2 (two) times daily. Tysinger, Alm RAMAN, PA-C   Active             Recommendation:   Continue Current Plan of Care  Follow Up Plan:   Telephone follow-up in 1 month  Rolin Kerns, LCSW [facility]  [facility], [facility] Clinical Social Worker Direct Dial: [phone]  Fax: [phone] Website: [email or web address] 1:57 PM

## 2024-11-07 ENCOUNTER — Other Ambulatory Visit: Payer: Self-pay | Admitting: Licensed Clinical Social Worker

## 2024-11-07 NOTE — Patient Instructions (Signed)
 Visit Information  Thank you for taking time to visit with me today. Please don't hesitate to contact me if I can be of assistance to you before our next scheduled appointment.  Our next appointment is by telephone on 2/30/2026 at 3:30 pm Please call the care guide team at [phone] if you need to cancel or reschedule your appointment.   Following is a copy of your care plan:   Goals Addressed             This Visit's Progress    BSW VBCI Social Work Care Plan       Current SDOH Barriers:  Wants to be refereed to MOW and Companion care from insurance if covered  Interventions: Patient interviewed and appropriate screenings performed Referred patient to community resources  Provided patient with information about Meals on Wheels Advised patient to (The daughter) to call [employer] to see what plan the patient has to see if companionship services are covered.           Please call the Suicide and Crisis Lifeline: 988 go to [facility] [address] [place] [phone]) call 911 if you are experiencing a Mental Health or Behavioral Health Crisis or need someone to talk to.  Daughter [name] Marina  verbalized understanding of Care plan and visit instructions communicated this visit  Tobias CHARM Maranda HEDWIG, PhD [facility], [facility] Social Worker Direct Dial: [phone]  Fax: [phone]

## 2024-11-07 NOTE — Patient Outreach (Signed)
 Social Drivers of Health  Community Resource and Care Coordination Visit Note   11/07/2024  Name: Kristina Maldonado MRN: [id number] DOB:05-14-1941  Situation: Referral received for [facility] needs assessment and assistance related to Social Connections Companionship and MOW. I obtained verbal consent from Caregiver Patient Daughter [name] Marina.  Visit completed with daughter [name] on the phone.   Background:   SDOH Interventions Today    Flowsheet Row Most Recent Value  SDOH Interventions   Food Insecurity Interventions Intervention Not Indicated  Housing Interventions Intervention Not Indicated  Transportation Interventions Intervention Not Indicated  Utilities Interventions Intervention Not Indicated     Assessment:   Goals Addressed             This Visit's Progress    BSW VBCI Social Work Care Plan       Current SDOH Barriers:  Wants to be refereed to MOW and Companion care from insurance if covered  Interventions: Patient interviewed and appropriate screenings performed Referred patient to community resources  Provided patient with information about Meals on Wheels Advised patient to (The daughter) to call [employer] to see what plan the patient has to see if companionship services are covered.           Recommendation:   ask for help if you don't understand your health insurance benefits Patients daughter will call [employer] to inquire about the services that are covered and to see if companioship is covered. SW will make a referral to MOW  Follow Up Plan:   Telephone follow up appointment date/time:  2/30/2026 at 3:30 pm   Tobias CHARM Maranda HEDWIG, PhD [facility], [facility] Social Worker Direct Dial: [phone]  Fax: [phone]

## 2024-11-11 ENCOUNTER — Ambulatory Visit: Admitting: Podiatry

## 2024-11-12 ENCOUNTER — Ambulatory Visit (INDEPENDENT_AMBULATORY_CARE_PROVIDER_SITE_OTHER): Admitting: Podiatry

## 2024-11-12 DIAGNOSIS — Z91198 Patient's noncompliance with other medical treatment and regimen for other reason: Secondary | ICD-10-CM

## 2024-11-12 NOTE — Progress Notes (Signed)
 1. Failure to attend appointment with reason given    Appointment canceled by patient. Will call back to reschedule.

## 2024-11-21 ENCOUNTER — Encounter: Payer: Self-pay | Admitting: Licensed Clinical Social Worker

## 2024-11-21 ENCOUNTER — Other Ambulatory Visit: Payer: Self-pay | Admitting: Licensed Clinical Social Worker

## 2024-11-24 ENCOUNTER — Encounter: Payer: Self-pay | Admitting: Licensed Clinical Social Worker

## 2024-12-03 ENCOUNTER — Telehealth: Admitting: Licensed Clinical Social Worker

## 2024-12-08 ENCOUNTER — Other Ambulatory Visit: Admitting: Licensed Clinical Social Worker

## 2025-03-02 ENCOUNTER — Ambulatory Visit: Payer: Self-pay | Admitting: Family Medicine
